# Patient Record
Sex: Female | Born: 1970 | Race: White | Hispanic: No | Marital: Married | Smoking: Never smoker
Health system: Southern US, Community
[De-identification: ages and names within clinical notes are randomized; demographics above are authoritative.]

## PROBLEM LIST (undated history)

## (undated) DIAGNOSIS — K2270 Barrett's esophagus without dysplasia: Secondary | ICD-10-CM

## (undated) DIAGNOSIS — K219 Gastro-esophageal reflux disease without esophagitis: Secondary | ICD-10-CM

## (undated) DIAGNOSIS — R519 Headache, unspecified: Secondary | ICD-10-CM

## (undated) DIAGNOSIS — H349 Unspecified retinal vascular occlusion: Secondary | ICD-10-CM

## (undated) DIAGNOSIS — F419 Anxiety disorder, unspecified: Secondary | ICD-10-CM

## (undated) DIAGNOSIS — J45909 Unspecified asthma, uncomplicated: Secondary | ICD-10-CM

## (undated) DIAGNOSIS — M797 Fibromyalgia: Secondary | ICD-10-CM

## (undated) DIAGNOSIS — J939 Pneumothorax, unspecified: Secondary | ICD-10-CM

## (undated) DIAGNOSIS — N83299 Other ovarian cyst, unspecified side: Secondary | ICD-10-CM

## (undated) DIAGNOSIS — I10 Essential (primary) hypertension: Secondary | ICD-10-CM

## (undated) DIAGNOSIS — M069 Rheumatoid arthritis, unspecified: Secondary | ICD-10-CM

## (undated) DIAGNOSIS — R51 Headache: Secondary | ICD-10-CM

## (undated) HISTORY — DX: Barrett's esophagus without dysplasia: K22.70

## (undated) HISTORY — PX: INTRAUTERINE DEVICE (IUD) INSERTION: SHX5877

## (undated) HISTORY — DX: Essential (primary) hypertension: I10

## (undated) HISTORY — DX: Pneumothorax, unspecified: J93.9

## (undated) HISTORY — DX: Headache, unspecified: R51.9

## (undated) HISTORY — DX: Unspecified asthma, uncomplicated: J45.909

## (undated) HISTORY — PX: CHOLECYSTECTOMY: SHX55

## (undated) HISTORY — DX: Rheumatoid arthritis, unspecified: M06.9

## (undated) HISTORY — DX: Headache: R51

## (undated) HISTORY — PX: OTHER SURGICAL HISTORY: SHX169

## (undated) HISTORY — DX: Unspecified retinal vascular occlusion: H34.9

## (undated) HISTORY — DX: Fibromyalgia: M79.7

## (undated) HISTORY — DX: Anxiety disorder, unspecified: F41.9

## (undated) HISTORY — PX: TONSILLECTOMY: SUR1361

## (undated) HISTORY — DX: Gastro-esophageal reflux disease without esophagitis: K21.9

## (undated) HISTORY — DX: Other ovarian cyst, unspecified side: N83.299

---

## 1999-03-30 ENCOUNTER — Other Ambulatory Visit: Admission: RE | Admit: 1999-03-30 | Discharge: 1999-03-30 | Payer: Self-pay | Admitting: Family Medicine

## 1999-09-02 ENCOUNTER — Encounter: Admission: RE | Admit: 1999-09-02 | Discharge: 1999-09-02 | Payer: Self-pay | Admitting: Family Medicine

## 1999-09-02 ENCOUNTER — Encounter: Payer: Self-pay | Admitting: Family Medicine

## 2000-03-27 ENCOUNTER — Other Ambulatory Visit: Admission: RE | Admit: 2000-03-27 | Discharge: 2000-03-27 | Payer: Self-pay | Admitting: Family Medicine

## 2001-01-03 ENCOUNTER — Encounter: Payer: Self-pay | Admitting: Family Medicine

## 2001-01-03 ENCOUNTER — Encounter: Admission: RE | Admit: 2001-01-03 | Discharge: 2001-01-03 | Payer: Self-pay | Admitting: Family Medicine

## 2001-03-16 ENCOUNTER — Encounter: Payer: Self-pay | Admitting: Orthopedic Surgery

## 2001-03-16 ENCOUNTER — Ambulatory Visit (HOSPITAL_COMMUNITY): Admission: RE | Admit: 2001-03-16 | Discharge: 2001-03-16 | Payer: Self-pay | Admitting: Orthopedic Surgery

## 2001-03-28 ENCOUNTER — Other Ambulatory Visit: Admission: RE | Admit: 2001-03-28 | Discharge: 2001-03-28 | Payer: Self-pay | Admitting: Obstetrics and Gynecology

## 2002-04-02 ENCOUNTER — Other Ambulatory Visit: Admission: RE | Admit: 2002-04-02 | Discharge: 2002-04-02 | Payer: Self-pay | Admitting: Obstetrics and Gynecology

## 2002-11-08 ENCOUNTER — Encounter: Admission: RE | Admit: 2002-11-08 | Discharge: 2002-11-08 | Payer: Self-pay

## 2002-11-12 ENCOUNTER — Encounter: Admission: RE | Admit: 2002-11-12 | Discharge: 2002-11-19 | Payer: Self-pay

## 2003-02-11 ENCOUNTER — Other Ambulatory Visit: Admission: RE | Admit: 2003-02-11 | Discharge: 2003-02-11 | Payer: Self-pay | Admitting: Obstetrics & Gynecology

## 2003-04-11 ENCOUNTER — Ambulatory Visit (HOSPITAL_COMMUNITY): Admission: RE | Admit: 2003-04-11 | Discharge: 2003-04-11 | Payer: Self-pay | Admitting: Obstetrics & Gynecology

## 2004-02-20 ENCOUNTER — Ambulatory Visit (HOSPITAL_COMMUNITY): Admission: RE | Admit: 2004-02-20 | Discharge: 2004-02-20 | Payer: Self-pay | Admitting: Gastroenterology

## 2004-02-21 ENCOUNTER — Inpatient Hospital Stay (HOSPITAL_COMMUNITY): Admission: AD | Admit: 2004-02-21 | Discharge: 2004-02-21 | Payer: Self-pay | Admitting: Obstetrics and Gynecology

## 2004-03-03 ENCOUNTER — Ambulatory Visit (HOSPITAL_COMMUNITY): Admission: RE | Admit: 2004-03-03 | Discharge: 2004-03-03 | Payer: Self-pay | Admitting: *Deleted

## 2004-09-17 ENCOUNTER — Inpatient Hospital Stay (HOSPITAL_COMMUNITY): Admission: AD | Admit: 2004-09-17 | Discharge: 2004-09-17 | Payer: Self-pay | Admitting: Obstetrics & Gynecology

## 2004-09-20 ENCOUNTER — Encounter (INDEPENDENT_AMBULATORY_CARE_PROVIDER_SITE_OTHER): Payer: Self-pay | Admitting: Specialist

## 2004-09-20 ENCOUNTER — Inpatient Hospital Stay (HOSPITAL_COMMUNITY): Admission: AD | Admit: 2004-09-20 | Discharge: 2004-09-22 | Payer: Self-pay | Admitting: Obstetrics and Gynecology

## 2004-11-15 ENCOUNTER — Encounter: Admission: RE | Admit: 2004-11-15 | Discharge: 2004-11-15 | Payer: Self-pay | Admitting: Obstetrics & Gynecology

## 2005-08-22 ENCOUNTER — Emergency Department (HOSPITAL_COMMUNITY): Admission: EM | Admit: 2005-08-22 | Discharge: 2005-08-22 | Payer: Self-pay | Admitting: Emergency Medicine

## 2006-11-01 ENCOUNTER — Encounter (INDEPENDENT_AMBULATORY_CARE_PROVIDER_SITE_OTHER): Payer: Self-pay | Admitting: *Deleted

## 2006-11-02 ENCOUNTER — Ambulatory Visit: Payer: Self-pay | Admitting: Emergency Medicine

## 2006-11-02 ENCOUNTER — Inpatient Hospital Stay (HOSPITAL_COMMUNITY): Admission: RE | Admit: 2006-11-02 | Discharge: 2006-11-06 | Payer: Self-pay | Admitting: *Deleted

## 2006-11-08 ENCOUNTER — Ambulatory Visit: Admission: AD | Admit: 2006-11-08 | Discharge: 2006-11-08 | Payer: Self-pay | Admitting: *Deleted

## 2006-11-11 ENCOUNTER — Inpatient Hospital Stay (HOSPITAL_COMMUNITY): Admission: AD | Admit: 2006-11-11 | Discharge: 2006-11-11 | Payer: Self-pay | Admitting: Obstetrics & Gynecology

## 2006-11-13 ENCOUNTER — Inpatient Hospital Stay (HOSPITAL_COMMUNITY): Admission: AD | Admit: 2006-11-13 | Discharge: 2006-11-13 | Payer: Self-pay | Admitting: Obstetrics and Gynecology

## 2006-11-15 ENCOUNTER — Ambulatory Visit: Payer: Self-pay | Admitting: Vascular Surgery

## 2006-11-15 ENCOUNTER — Ambulatory Visit (HOSPITAL_COMMUNITY): Admission: RE | Admit: 2006-11-15 | Discharge: 2006-11-15 | Payer: Self-pay | Admitting: Obstetrics & Gynecology

## 2006-11-15 ENCOUNTER — Encounter (INDEPENDENT_AMBULATORY_CARE_PROVIDER_SITE_OTHER): Payer: Self-pay | Admitting: Obstetrics & Gynecology

## 2006-11-30 ENCOUNTER — Encounter: Admission: RE | Admit: 2006-11-30 | Discharge: 2006-11-30 | Payer: Self-pay | Admitting: *Deleted

## 2006-12-01 ENCOUNTER — Emergency Department (HOSPITAL_COMMUNITY): Admission: EM | Admit: 2006-12-01 | Discharge: 2006-12-01 | Payer: Self-pay | Admitting: Emergency Medicine

## 2006-12-01 ENCOUNTER — Ambulatory Visit: Payer: Self-pay | Admitting: Emergency Medicine

## 2007-01-26 ENCOUNTER — Encounter: Admission: RE | Admit: 2007-01-26 | Discharge: 2007-01-26 | Payer: Self-pay | Admitting: Obstetrics & Gynecology

## 2007-04-26 ENCOUNTER — Telehealth (INDEPENDENT_AMBULATORY_CARE_PROVIDER_SITE_OTHER): Payer: Self-pay | Admitting: *Deleted

## 2007-05-07 ENCOUNTER — Telehealth (INDEPENDENT_AMBULATORY_CARE_PROVIDER_SITE_OTHER): Payer: Self-pay | Admitting: *Deleted

## 2007-05-08 ENCOUNTER — Ambulatory Visit: Payer: Self-pay | Admitting: Cardiology

## 2007-05-14 ENCOUNTER — Ambulatory Visit: Payer: Self-pay | Admitting: Emergency Medicine

## 2007-05-14 DIAGNOSIS — J984 Other disorders of lung: Secondary | ICD-10-CM | POA: Insufficient documentation

## 2007-05-14 DIAGNOSIS — J930 Spontaneous tension pneumothorax: Secondary | ICD-10-CM | POA: Insufficient documentation

## 2007-05-14 DIAGNOSIS — J939 Pneumothorax, unspecified: Secondary | ICD-10-CM | POA: Insufficient documentation

## 2007-05-14 DIAGNOSIS — N200 Calculus of kidney: Secondary | ICD-10-CM | POA: Insufficient documentation

## 2008-04-21 IMAGING — CR DG CHEST 2V
3 series · 3 of 3 positions shown · non-contrast
Comparison: 11/05/06.

CLINICAL DATA: Chest pain and shortness of breath.  Follow-up right pneumothorax.
 CHEST - 2 VIEW:

[view not recorded (1 of 3)]
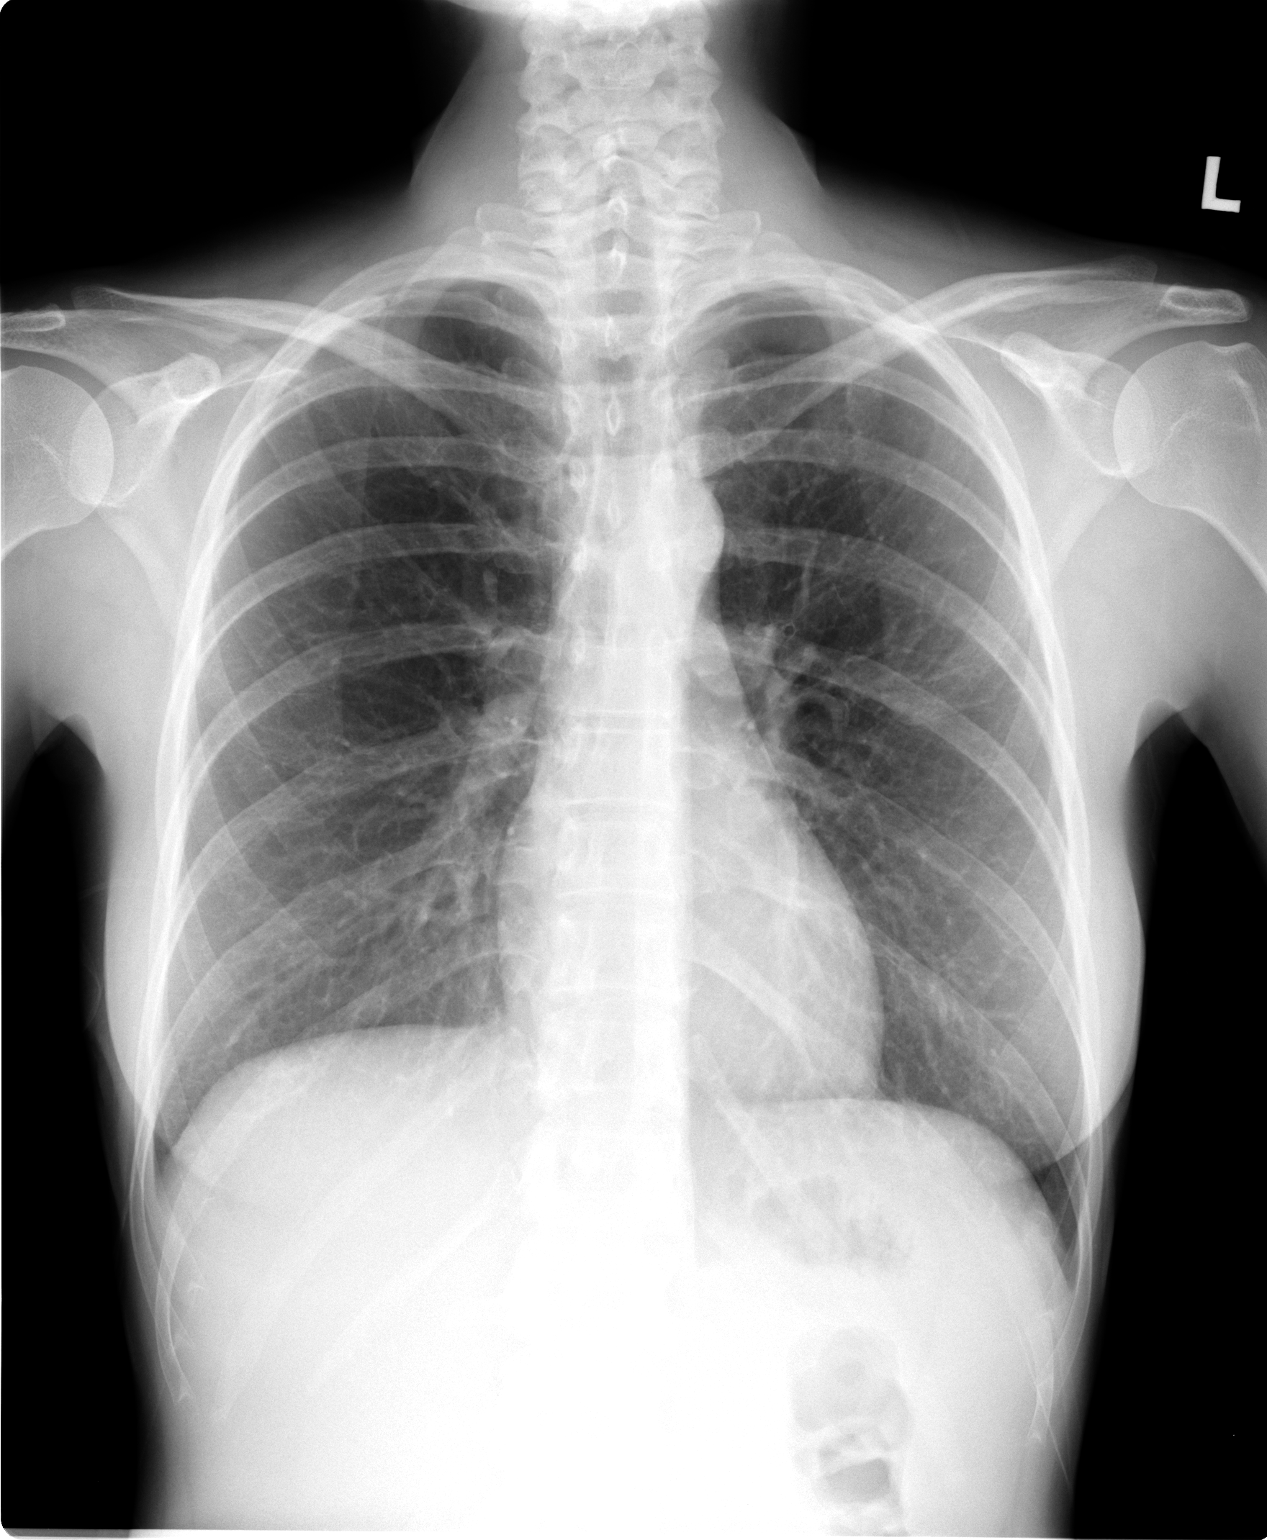

[view not recorded (2 of 3)]
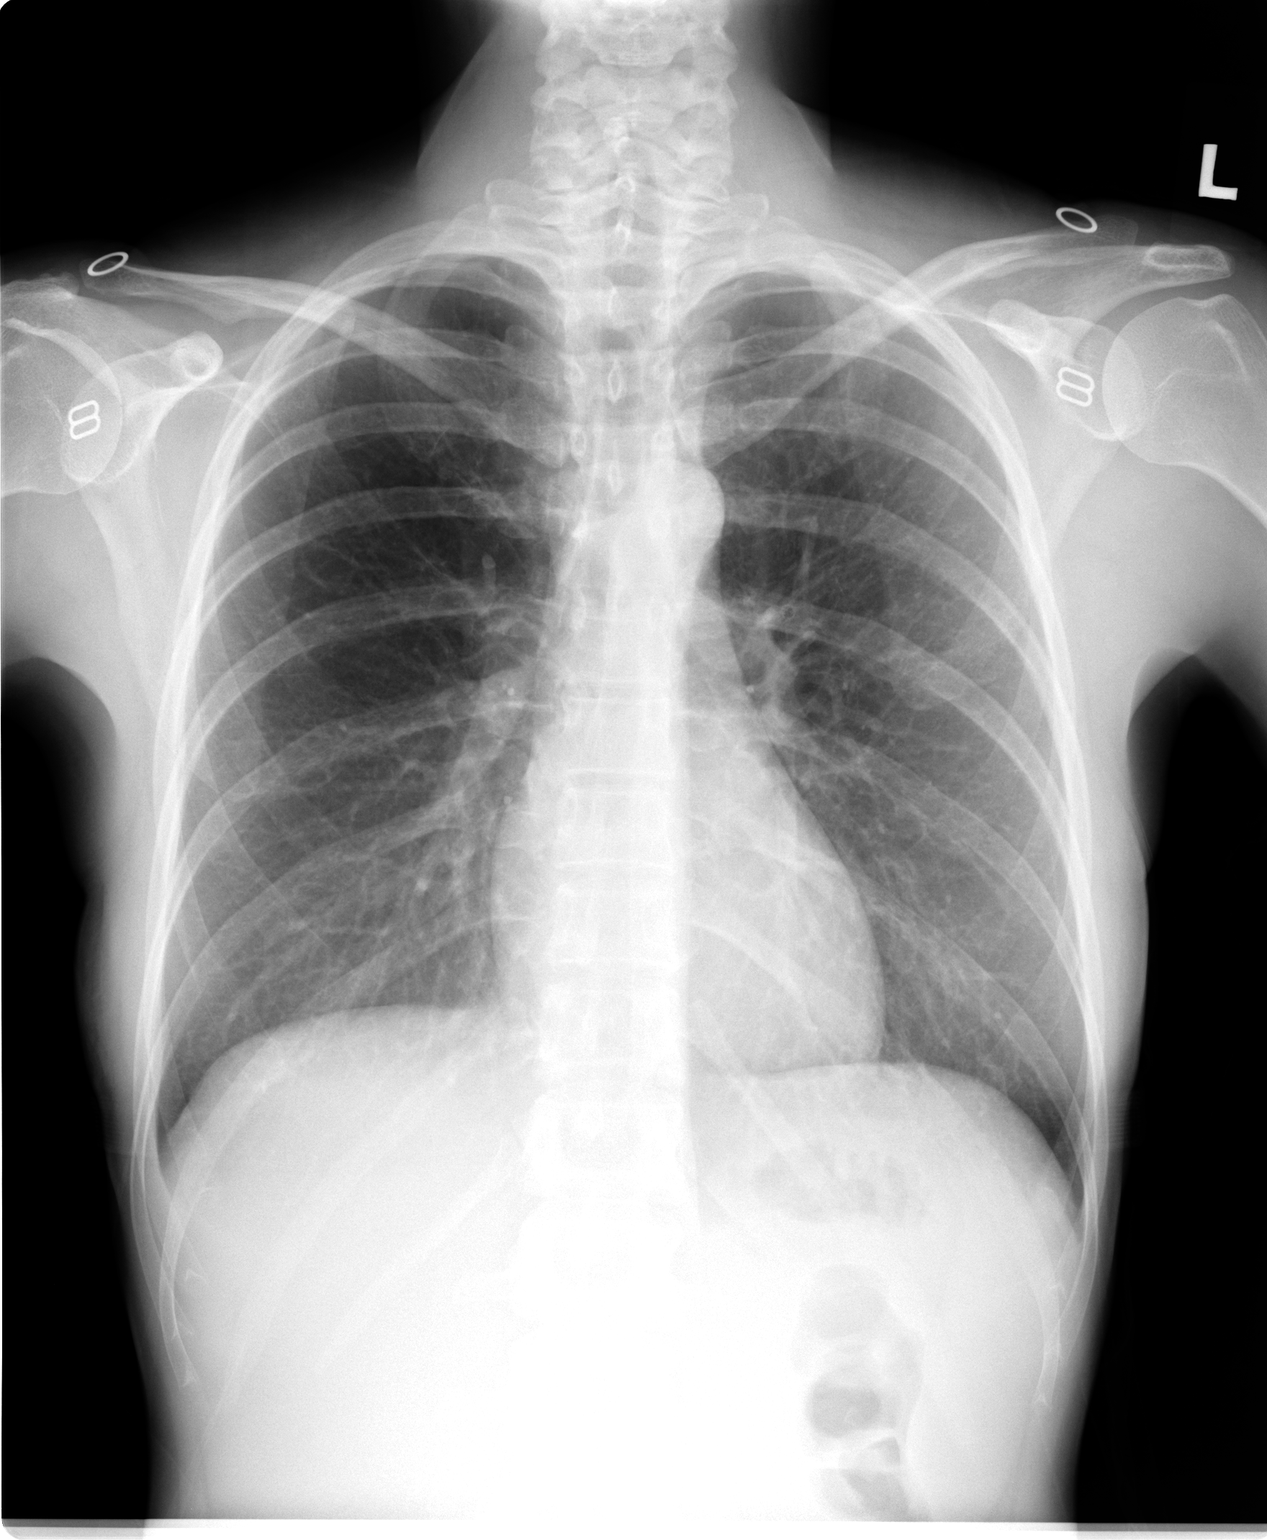

[view not recorded (3 of 3)]
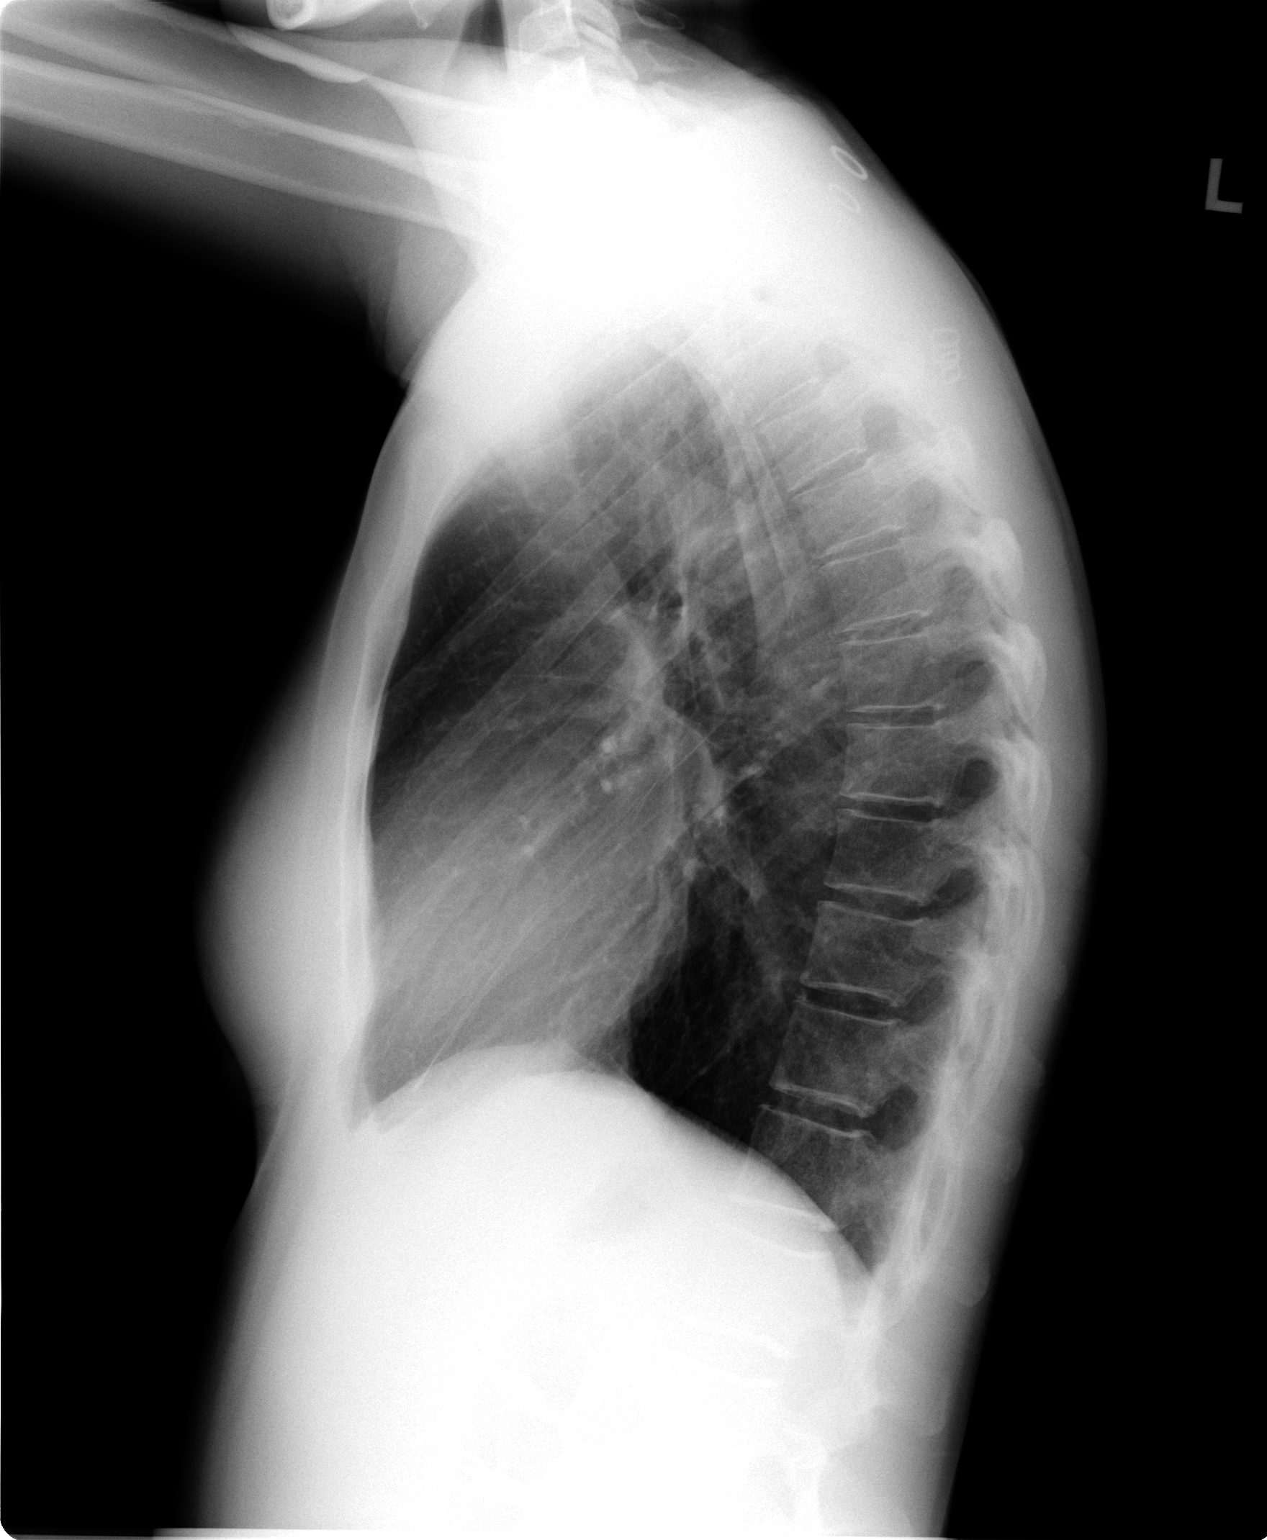

[3 of 3 positions shown; findings below may reference images not displayed]

FINDINGS: Previously seen right apical pneumothorax has resolved.  Both lungs are clear.  There is no evidence of pleural effusion.  Heart size and mediastinal contours are normal.
IMPRESSION: No active cardiopulmonary disease.

## 2008-10-26 ENCOUNTER — Encounter: Admission: RE | Admit: 2008-10-26 | Discharge: 2008-10-26 | Payer: Self-pay | Admitting: Family Medicine

## 2009-06-22 ENCOUNTER — Inpatient Hospital Stay (HOSPITAL_COMMUNITY): Admission: AD | Admit: 2009-06-22 | Discharge: 2009-06-22 | Payer: Self-pay | Admitting: Obstetrics & Gynecology

## 2009-07-22 ENCOUNTER — Inpatient Hospital Stay (HOSPITAL_COMMUNITY): Admission: AD | Admit: 2009-07-22 | Discharge: 2009-07-24 | Payer: Self-pay | Admitting: Obstetrics & Gynecology

## 2010-06-27 ENCOUNTER — Encounter: Payer: Self-pay | Admitting: Obstetrics & Gynecology

## 2010-06-27 ENCOUNTER — Encounter: Payer: Self-pay | Admitting: Emergency Medicine

## 2010-08-25 LAB — CBC
HCT: 31 % — ABNORMAL LOW (ref 36.0–46.0)
HCT: 39.1 % (ref 36.0–46.0)
Hemoglobin: 10.4 g/dL — ABNORMAL LOW (ref 12.0–15.0)
MCHC: 33.1 g/dL (ref 30.0–36.0)
MCHC: 33.6 g/dL (ref 30.0–36.0)
MCV: 86.8 fL (ref 78.0–100.0)
Platelets: 218 10*3/uL (ref 150–400)
RBC: 4.5 MIL/uL (ref 3.87–5.11)
RDW: 14.2 % (ref 11.5–15.5)
WBC: 8.2 10*3/uL (ref 4.0–10.5)

## 2010-10-19 NOTE — Consult Note (Signed)
[name], [name] NO.:  [id number]   MEDICAL RECORD NO.:  [id number]          PATIENT TYPE:  MAT   LOCATION:  MATC                          FACILITY:  [facility]   PHYSICIAN:  Lenoard Aden, M.D.DATE OF BIRTH:  [birth year]   DATE OF CONSULTATION:  DATE OF DISCHARGE:                                 CONSULTATION   EMERGENCY ROOM CONSULTATION NOTE   CHIEF COMPLAINT:  Difficulty breathing.   She is a 40 year old white female who is status post a complicated left  ovarian cystectomy with fulguration of endometriosis.  Postoperatively,  she had a recto-vaginal hematoma which enlarged and became subsequently  symptomatic and was subsequently drained on November 07, 2006 without  complications.  She presented to the Emergency Room subsequently on November 11, 2006 with a diagnosis of urinary tract infection for which she was  started on Macrobid.   ALLERGIES:  She has allergies to:  1. Nubain.  2. Ditropan.  3. Erythromycin.  4. Codeine.  5 Cipro.  1. Lodine.  2. Zithromax.  3. Walnuts.  4. Pecans.  5. Strawberries.  6. Animal dander.   She had a history of cholecystectomy, fibromyalgia, brachio plexus  disorder, and female infertility, esophageal reflux and laparoscopy as  noted.   She is a non-smoker, non-drinker.  She is denying domestic or physical  violence.   MEDICATIONS:  Per previous report include:  1. Ferrous sulfate.  2. Flomax  3. Dilaudid  4. Yasmin  5. Singulair  6. Allegra   She has a family history of diabetes,  heart disease, chronic  hypertension, myocardia infarction and colorectal cancer.   She has had one pregnancy.   PHYSICAL EXAMINATION:  She is a well developed, well nourished white  female in no acute distress.  HEENT:  Normal.  LUNGS:  Decreased breath sounds throughout the right lung field, normal  left lung fields.  HEART:  Regular rhythm.  ABDOMEN:  Soft, nontender.  All incisions well healed from previous  chest tube.   Reveals a well healed incision and three laparoscopic sites  which are intact and well healed.  PELVIC EXAM:  Deferred.  EXTREMITIES: Showed no cords.  NEUROLOGIC EXAM:  Nonfocal.   IMPRESSION:  1. Status post complicated laparoscopy with rectovaginal hematoma, now      status post drainage on November 08, 2006, with marked improvement in      her lower abdominal discomfort.  2. Status post spontaneous pneumothorax with chest tube placement.      Chest x-ray today is stable per Dr. Delton Coombes who previously placed her      chest tube.  A verbal discussion with this patient regarding this      patient performed with Dr. Delton Coombes.  3. Urinary tract infection, on Macrobid.   PLAN:  To discharge home.  Continue Dilaudid as needed.  Continue  Macrobid.  She has follow-up scheduled in the office in 24 hours.  Bleeding precautions and pain precautions were given.  The patient  acknowledges and will proceed.      Lenoard Aden, M.D.  Electronically Signed  RJT/MEDQ  D:  11/13/2006  T:  11/13/2006  Job:  [id number]

## 2010-10-19 NOTE — Discharge Summary (Signed)
NAMEBRITTIANY, WIEHE               ACCOUNT NO.:  [id number]   MEDICAL RECORD NO.:  [id number]          PATIENT TYPE:  INP   LOCATION:  9374                          [facility]ACILITY:  [facility]   PHYSICIAN:  Gerri Spore [name], M.D.  DATE OF BIRTH:  Apr 17, 1971   DATE OF ADMISSION:  11/02/2006  DATE OF DISCHARGE:  11/06/2006                               DISCHARGE SUMMARY   ADMISSION DIAGNOSES:  1. Lower abdominal pain.  2. Left ovarian cyst, suspected dermoid.   DISCHARGE DIAGNOSES:  1. Lower abdominal pain.  2. Left ovarian cyst, dermoid cyst on pathology.  3. Endometriosis of the posterior cul-de-sac.  4. Recto-vaginal septum hematoma.  5. Postoperative pneumothorax.  6. Urinary retention.  7. Anemia.   HISTORY OF PRESENT ILLNESS:  For the complete details, please see the  History and Physical on the chart.  Briefly, the patient presented as a  40 year old white female with a history of lower pelvic pain, previous  history of infertility and ultrasound findings suspicious of a left  dermoid cyst.   The patient underwent laparoscopy and left ovarian cystectomy.  She was  incidentally noted to have endometriosis of the posterior cul-de-sac.  Given her history of general pelvic pain and remote history of  infertility, this was fulgurated with cautery.  It was primarily in the  posterior cul-de-sac.  There was slight bleeding from the cul-de-sac  made hemostatic with a single laparoscopic stitch.  Cystoscopy was  performed which showed patency of the ureters.   HOSPITAL COURSE:  Postoperatively in the recovery room the patient had  complaints of lower rectal pressure and pelvic pressure.  She was kept  overnight for pain control.  Serial hemoglobins were followed given the  suspicion for rectovaginal hematoma.  She did drop her hemoglobin down  to 6.8 and was moderately symptomatic with dizziness and tachycardia.  She was transfused 2 units of packed cells, and her hemoglobin  stabilized  ultimately at 8.9.   To determine the location of bleeding, a CT of the abdomen and pelvis  was ordered.  This confirmed a 9-cm rectovaginal septum hematoma.  An  incidental note was made of an apparent right pneumothorax with  effusion.  Subsequent chest x-ray confirmed.  Consultation with  Pulmonary was obtained with Dr. Delton Coombes.  Given the fluid in the  pneumothorax, recommendation was made for chest tube placement.  It was  explained to the patient that is thought to be due to gas leaking across  a diaphragmatic hernia such as at the passage of the great vessels and  esophagus through the diaphragm.  It has been described in literature  within GYN and general surgery literature in the past.   Her pneumothorax resolved.  Chest tube was placed to water seal and did  not leak.  It was subsequently removed and follow-up chest x-ray showed  no reaccumulation.   Ultimately the patient was able to ambulate and tolerate a regular diet  without difficulty and no further chest-related problems.  She did have  some urinary retention believed to be related to the use of Tylox.  She  was switched to Vicodin and started on Flomax with relief of symptoms.  Urine culture was sent from an in-and-out cath on the day prior to  discharge to rule out infection.   DISCHARGE MEDICATIONS:  1. Vicodin 1 tablet every 4-6 hours as needed for pain.  2. Flomax 0.4 mg p.o. daily.  3. Ferrous sulfate 325 mg p.o. Anni.d.  4. Colace 100 mg p.o. daily.   FOLLOW UP:  OB/GYN Dr. Earlene Frazier in 1 week.   DISPOSITION AT DISCHARGE:  Stable and improved.   DISCHARGE INSTRUCTIONS:  Cough or fever, difficulty voiding, abdominal  pain or other concerns.      Gerri Spore [name], M.D.  Electronically Signed     WBD/MEDQ  D:  11/06/2006  T:  11/06/2006  Job:  [id number]

## 2010-10-19 NOTE — Op Note (Signed)
NAMELEOTHA, WESTERMEYER               ACCOUNT NO.:  [id number]   MEDICAL RECORD NO.:  [id number]          PATIENT TYPE:  AMB   LOCATION:  DFTL                          [facility]:  [facility]   PHYSICIAN:  Chapin B. Earlene Plater, M.D.  DATE OF BIRTH:  1971-01-12   DATE OF PROCEDURE:  11/08/2006  DATE OF DISCHARGE:                               OPERATIVE REPORT   PREOPERATIVE DIAGNOSIS:  Rectovaginal hematoma.   POSTOPERATIVE DIAGNOSIS:  Rectovaginal hematoma.   PROCEDURE:  Drainage of rectovaginal hematoma.   SURGEON:  Chester Holstein. Earlene Plater, M.D.   ASSISTANT:  None.   ANESTHESIA:  Mask.   SPECIMENS:  None.   ESTIMATED BLOOD LOSS:  Minimal.   COMPLICATIONS:  None.   FINDINGS:  An approximately 4 cm defect in the lower third of the vagina  from previous rectovaginal hematoma that drained spontaneously.   INDICATIONS FOR PROCEDURE:  Patient with a recent history of laparoscopy  for ovarian cystectomy.  She was incidentally noted to have posterior  cul-de-sac endometriosis which was treated with cautery.  Bleeding  ensued which could not be stopped with cautery and was made hemostatic  with laparoscopic suturing.  Postoperatively, the patient developed  lower rectal pressure and pain, was kept for observation, noted to  become anemic on serial hemoglobin.  CT scan showed a 9 cm rectovaginal  septal hematoma and incidental note of a right pneumothorax.   She had a chest tube for the pneumothorax at pulmonary advisement.  She  also was given blood for her symptomatic anemia.  After consultation  with several community physicians as well as Dr. Noland Fordyce at [facility], the decision was made not to drain the hematoma out of fear of  seeding a sterile pocket of blood which would be a good culture medium  and potentially lead to infection.  The patient was advised that it  might drain spontaneously or could resolve on its own.  The patient  presented back to the office in follow up yesterday and was  having  increased vaginal drainage of old clotted blood.  Exam in the office was  difficult due to discomfort, but was felt to have a vaginal defect, at  least 3 or so cm in diameter, and copious amounts of clot returning.  The vaginal mucosa could not be adequately assessed given her  discomfort.  I recommended we examine her under anesthesia, debride any  necrotic tissue, and evacuate as much hematoma as possible to expedite  the recovery process.  I, once again, discussed the case with Dr. Noland Fordyce  at [facility] who recommended against closure of this draining spot as this  would most likely be an increased risk for infection.  The advice was  that it would heal spontaneously as long as allowed to drain.   DESCRIPTION OF PROCEDURE:  The patient was taken to the operating room,  MAC anesthesia obtained.  She was prepped and draped in a standard  fashion, bladder emptied with in and out cath.  Exam under anesthesia  showed a 3-4 cm transverse defect in the lower 1/3 of the vagina  consistent  with site of spontaneously draining rectovaginal septal  hematoma.  There was still about 200 mL of old clot in this space.  This  was copiously irrigated and evacuated.  The edges of the vaginal mucosa  looked healthy and were already showing signs of granulating at the  edges.  The space was examined and as much clot as could be removed was  removed with irrigation and suction.  The space was probed gently with a  finger and did not feel like it tracked into any other higher space.  Rectovaginal exam was also performed and again confirmed integrity of  the rectal mucosa and no involvement by the hematoma.  Given that the  hematoma was as completely evacuated as possible and no tissue needed to  be debrided, the procedure was terminated.  The patient tolerated the  procedure well.  There were no complications.  She was taken to the  recovery room awake, alert, in stable condition.      Gerri Spore B.  Earlene Plater, M.D.  Electronically Signed     WBD/MEDQ  D:  11/08/2006  T:  11/08/2006  Job:  [id number]

## 2010-10-19 NOTE — Op Note (Signed)
NAMEROBENA, EWY               ACCOUNT NO.:  [id number]   MEDICAL RECORD NO.:  [id number]          PATIENT TYPE:  AMB   LOCATION:  SDC                           [facility]   PHYSICIAN:  Henry B. Earlene Plater, M.D.  DATE OF BIRTH:  Feb 06, 1971   DATE OF PROCEDURE:  11/01/2006  DATE OF DISCHARGE:                               OPERATIVE REPORT   PREOPERATIVE DIAGNOSES:  1. Left lower quadrant pain.  2. Left ovarian cyst, suspected dermoid.   POSTOPERATIVE DIAGNOSES:  1. Dermoid cyst, left ovary.  2. Absent right ovary.  3. Endometriosis in the posterior cul-de-sac.   PROCEDURES:  1. Laparoscopy.  2. Left ovarian cystectomy.  3. Fulguration of endometriosis of the posterior cul-de-sac.  4. Laparoscopic suturing of a bleeder in the posterior cul-de-sac.  5. Cystoscopy.   SURGEON:  Chester Holstein. Earlene Plater, M.D.   ASSISTANT:  None.   ANESTHESIA:  General.   SPECIMENS:  Left ovarian cyst, suspected dermoid, to pathology.   ESTIMATED BLOOD LOSS:  Minimal.   COMPLICATIONS:  None.   INDICATIONS:  Patient with a history of left lower quadrant pain.  Ultrasound showed a complex hyperechoic mass of the left ovary suspected  to be a dermoid cyst.  The patient was advised of the risks of surgery  including infection, bleeding, and damage to surrounding organs.   PROCEDURE:  The patient was taken to the operating room and general  anesthesia obtained, prepped and draped in a standard fashion, the  bladder emptied with a Foley catheter.  Exam under anesthesia showed the  uterus was retroverted.  A speculum inserted and the Hulka could not be  placed into the uterus; therefore, the single-tooth was attached to the  cervix and the acorn cannula inserted and inserted.   A 10 mm incision placed in the umbilicus and carried sharply to the  fascia.  The fascia was divided sharply and elevated, the posterior  sheath of the peritoneum elevated and entered sharply, a pursestring  suture of 0  Vicryl placed around the fascial defect.  A Hasson cannula  inserted and secured.  Pneumoperitoneum obtained with CO2 gas,  Trendelenburg position obtained.  An 11 mm port placed in the left lower  quadrant, a 5 mm in the right.  Each were Excel trocars, each placed  under direct laparoscopic visualization.   The pelvis was inspected with apparent left dermoid cyst and  endometriosis also noted in the posterior cul-de-sac.  The appendix was  not well-seen, presumed to be retrocecal.  There were adhesions in the  upper abdomen from previous laparoscopic cholecystectomy; therefore, the  liver edge and stomach were not well-seen.  The right ovary was noted to  be absent.  The patient reports no history of previous oophorectomy and,  of note, it was not seen on preop ultrasound.   The left ovary was approached first, the capsule incised with the plasma  spatula and the cyst separated from the ovary.  It did rupture slightly  and leak a little bit of sebaceous material; however, the remainder of  the cyst was removed easily and mostly  intact.  It was placed in a bag  and removed through the left lower quadrant port.   The bed of the ovary from the resection was made hemostatic with bipolar  cautery.   The endometriosis in the posterior cul-de-sac was again noted.  It  appeared to be fairly in the midline just inferior to the uterus and  into the cul-de-sac itself.  This was fulgurated with the plasma  spatula.  One area just to the right of midline began to bleed.  It  could not be made hemostatic with bipolar cautery using the [facility] or the standard Kleppingers; therefore, it was made hemostatic  with a figure-of-eight stitch laparoscopically of 0 Vicryl.  Given it  was slightly deviated from the midline, I elected to perform cystoscopy  to ensure integrity of the ureters.  Indigo carmine was given and  cystoscopy performed.  Each ureter was visualized and noted to freely   spill indigo carmine and no lesions of the bladder were noted.   The cystoscope was removed and attention returned to the abdomen,  pneumoperitoneum reobtained.  The area of suturing was hemostatic, as  was the left ovary.  No other abnormalities were seen and therefore the  procedure was terminated.   The inferior ports were removed.  Their sites were hemostatic.  The  scope was removed, gas released, the Hasson cannula removed.  The  umbilical incision elevated with the [employer] retractors, pursestring  suture snugged down.  This obliterated the fascial defect and no intra-  abdominal contents herniated through prior to closure.  The skin was  closed at the umbilicus with a running subcuticular stitch of 4-0 Vicryl  as it was oozing slightly at the skin edge.  The inferior ports were  closed with Dermabond.   The patient tolerated the procedure well with no complications.  She was  taken to the recovery room awake, alert, in stable condition.      Gerri Spore B. Earlene Plater, M.D.  Electronically Signed     WBD/MEDQ  D:  11/01/2006  T:  11/01/2006  Job:  [id number]

## 2010-10-19 NOTE — Consult Note (Signed)
NAMELIBBY, GOEHRING NO.:  [id number]   MEDICAL RECORD NO.:  [id number]          PATIENT TYPE:  INP   LOCATION:  9312                          [facility]ACILITY:  [facility]   PHYSICIAN:  Leslye Peer, MD    DATE OF BIRTH:  1971-04-26   DATE OF CONSULTATION:  11/02/2006  DATE OF DISCHARGE:                                 CONSULTATION   REASON FOR CONSULTATION:  We were asked by Dr. Delia Heady to see Ms.  Dornbush for a right hydropneumothorax and associated dyspnea.   BRIEF HISTORY:  Ms. 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  Her course  was complicated by postoperative pain, and she was found to have a  retrovaginal hematoma on CT scan of the chest.  She also began to  develop, last evening, right-sided pleuritic pain, dyspnea, and  hypoxemia.  She quickly corrected with placement of 2 liters per minute  by nasal cannula.  Her CT scan of the abdomen showed incidental  observation of right side hydropneumothorax.  We were consulted for  further management.   PAST MEDICAL HISTORY:  1. Allergic rhinitis.  2. Ovarian cyst.  3. Exercise-induced asthma, not currently on any maintenance      medications.  4. Status post cholecystectomy.  5. Status post tonsillectomy.   ALLERGIES:  1. CODEINE.  2. FLUOROQUINOLONES.  3. ERYTHROMYCIN.  4. AZITHROMYCIN.  5. FOOD ALLERGY TO NUTS.   HOME MEDICATIONS:  Singulair, Allegra, oral contraceptive pill, Xopenex  2 puffs q.4 h. p.r.n. for shortness of breath which she very  infrequently uses.   SOCIAL HISTORY:  The patient has a former, remote, small tobacco history  less than one pack year.  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  She denies any significant occupational exposures.  She has never  been exposed to  TB to her knowledge.   FAMILY HISTORY:  Noncontributory.   PHYSICAL EXAMINATION:  GENERAL:  This is a very pleasant thin woman who  is in no distress.  VITAL SIGNS:  Temperature 98.9, T-max 99.2, blood pressure 105/71, heart  rate 110-126, respiratory rate 20-22 per minute, SPO2 97% on 2 liters  per minute by nasal cannula.  HEENT:  The oropharynx is benign without lesion.  NECK:  Supple without lymphadenopathy or stridor.  LUNGS:  Significant for decreased breath sounds at the right base with  some superior bronchial breath sounds.  She has dullness on the right  half way up her thorax.  Her left side is clear.  HEART:  Her heart is regular but tachycardiac.  There is no murmur.  ABDOMEN:  Tender to palpation in the epigastric to suprapubic region.  There is no rebound.  She has hypoactive bowel sounds.  EXTREMITIES:  Have no cyanosis, clubbing or edema.  NEUROLOGIC:  She has a grossly nonfocal exam.   LABORATORY:  Hematocrit on presentation was  42.1.  This morning it was  19.7.  White blood cell count this morning 8.4, platelets 215.  Chest x-  ray performed this afternoon shows a 10-15% right-sided pneumothorax  with a moderate right-sided effusion and some air below the fluid at the  right hemidiaphragm.  CT scan the abdomen is as indicated above.   IMPRESSION:  1. Right hydropneumothorax, almost certainly secondary to her recent      laparoscopic procedure and the infusion of intraperitoneal gas.      Her fluid is likely reactive to the infused air.  I expect that we      will find that this is an eosinophilic fluid, as is often the case      in such a reactive effusion.  I will obtain a portable chest x-ray      now.  If her pneumothorax is increasing in size then at least she      will need a tube thoracostomy.  If the pneumothorax is no bigger,      than I recommend a thoracentesis either today or tomorrow (if the      pneumothorax is stable in size) to assess the fluid  chemistries and      also for symptomatic relief.  Her pneumothorax can be followed      expectantly if it is not enlarging after her pleural fluid is      drained.  2. History of exercise-induced bronchospasm without any evidence of      current exacerbation.  I will not start her on any bronchodilators.   Thank you very much.      Leslye Peer, MD  Electronically Signed     RSB/MEDQ  D:  11/02/2006  T:  11/02/2006  Job:  [id number]   cc:   Gerri Spore B. Earlene Plater, M.D.  Fax: [phone]

## 2010-10-19 NOTE — Assessment & Plan Note (Signed)
[facility]                             PULMONARY OFFICE NOTE   NUR, RABOLD                      MRN:          [id number]  DATE:12/01/2006                            DOB:          18-Oct-1970    SUBJECTIVE:  [name] is a 40 year old woman whom I met at [facility] when she was admitted for gynecological surgery. That surgery  was complicated by a right sided pneumothorax. I placed a right tube  thoracostomy and this was removed on 6/1. She was discharged from the  hospital. She did not experience any postoperative pain. Her breathing  was unlabored and she had no pleuritic symptoms. She was re-hospitalized  on 6/4 for a repeat surgical repair. Around that time, she also  complained of right calf pain. A right lower extremity Doppler  ultrasound was performed that did not show any evidence of DVT. Since  that second outpatient procedure, she has had at least one episode of  right sided pleuritic chest discomfort, and a chest x-ray has been  performed that did not show any pneumothorax. She has also had  hematuria. There has been some suspicion that she may have renal  calculi. She denies any cough. Beginning yesterday, 6/26, at midday, she  began to experience dull, mid-right, back pain that is not significantly  changed by position. It may be worse with a deep breath. She denies any  trauma or any significant exertion that would have caused a  musculoskeletal injury. She is not having any shortness of breath. She  did have some nausea and vomiting.   CURRENT MEDICATIONS:  1. Birth control pill.  2. Iron supplements.  3. Allegra 180 mg daily.  4. Vicodin p.r.n.   ALLERGIES:  1. NEWBANE.  2. DITROPAN.  3. ERYTHROMYCIN.  4. CODEINE.  5. CIPROFLOXACIN.  6. IODINE.  7. ZITHROMAX.   PHYSICAL EXAMINATION:  GENERAL:  This is a pleasant thin woman who is no  distress on room air.  VITAL SIGNS:  Weight 102 pounds, temperature 98.6,  blood pressure 94/66,  heart rate 83, SPO2 100% on room air.  HEENT:  Oropharynx benign.  NECK:  Supple without lymphadenopathy.  LUNGS:  Clear without any evidence of wheezing or crackles. She does not  have any significant musculoskeletal tenderness. Her tube thoracostomy  scar is intact without evidence of hernia.  HEART:  Regular rate and rhythm without murmur.  ABDOMEN:  Benign.  EXTREMITIES:  No cyanosis, clubbing, or edema. She does not have any  palpable cords in her popliteal fosse.   IMPRESSION:  New onset chest pain. I suspect that this is  musculoskeletal in nature. A chest x-ray performed today does not show  any evidence of pneumothorax, residual plural disease, or infiltrate. I  do feel that we should rule out pulmonary embolism, as she is a  moderate risk patient, and her pain is acute and onset. I will arrange  for a CT pulmonary angiogram or a VQ scan as soon as possible, and then  review the results with her.     Leslye Peer, MD  Electronically Signed    RSB/MedQ  DD: 12/01/2006  DT: 12/02/2006  Job #: [id number]   cc:   Crecencio Mc, M.D.  Gerri Spore B. Earlene Plater, M.D.

## 2010-10-22 NOTE — H&P (Signed)
NAMEJESSIKA, Frazier NO.:  [id number]   MEDICAL RECORD NO.:  [id number]          PATIENT TYPE:  MAT   LOCATION:  [facility]                          FACILITY:  [facility]   PHYSICIAN:  Richardean Sale, M.D.   DATE OF BIRTH:  Jan 02, 1971   DATE OF ADMISSION:  09/20/2004  DATE OF DISCHARGE:                                HISTORY & PHYSICAL   ADMITTING DIAGNOSIS:  Thirty-eight-plus-week intrauterine pregnancy with  ruptured membranes, active labor.   HISTORY OF PRESENT ILLNESS:  This is a 40 year old gravida 1 para 0 white  female at 38+ weeks with a due date of September 28, 2004 by ultrasound in the  first trimester who presents to maternity admissions complaining of  spontaneous rupture of membranes at approximately 3 p.m. The patient notes  fluid is clear, denies any vaginal bleeding, reports contractions every 3-5  minutes, and reports good movement. Prenatal care has been at [facility] with Dr. Genia Del as the primary attending. Pregnancy has  been complicated by cholecystectomy performed in the first trimester for  chronic cholecystitis.   PAST OBSTETRIC HISTORY:  Gravida 1 para 0.   GYNECOLOGIC HISTORY:  No abnormal Pap smears or STDs.   PAST MEDICAL HISTORY:  Reflux, scoliosis not requiring any surgery.   SURGICAL HISTORY:  Cholecystectomy.   FAMILY HISTORY:  No known birth defects, congenital anomalies, Down's  syndrome, spina bifida.   SOCIAL HISTORY:  Is married. No tobacco, alcohol, or drugs.   ALLERGIES:  1.  ERYTHROMYCIN.  2.  PENICILLIN.  3.  ZITHROMAX.  4.  CIPRO.  5.  LODEINE.  6.  CODEINE.   MEDICATIONS:  1.  Darvocet one tablet p.o. q.4-6h. p.r.n. pain.  2.  Flexeril 5 mg p.o. t.i.d. p.r.n. back pain.  3.  Prenatal vitamins and iron.   PRENATAL LABORATORY DATA:  The patient is Rh positive. Hepatitis B surface  antigen nonreactive. Rubella immune. RPR nonreactive. Group B beta strep  negative. One-hour Glucola within normal  limits.   PHYSICAL EXAMINATION:  VITAL SIGNS:  She is afebrile, vital signs are  stable.  GENERAL:  She is a well-developed, well-nourished white female who appears  uncomfortable with contractions but in no acute distress. Appears in active  labor.  HEART:  Regular rate and rhythm.  LUNGS:  Clear to auscultation bilaterally.  ABDOMEN:  Soft between contractions. Her contractions palpate moderate.  Appears AGA. No right upper quadrant tenderness.  PELVIC:  Cervix is 3 cm, 100% effaced, 0 station, positive clear fluid.  EXTREMITIES:  No cyanosis, clubbing, or edema, nontender.   Fetal heart rate tracing is in the 120s to 130s and reactive. No  decelerations noted. Contractions irregular.   ASSESSMENT:  A 40 year old gravida 1 para 0 white female at 38+ weeks  gestation with spontaneous rupture of membranes and contractions.   PLAN:  1.  Will admit to labor and delivery.  2.  Check CBC and hold clot on admission.  3.  Group B beta strep negative.  4.  The patient may have Stadol and Phenergan p.r.n. discomfort and may have  epidural when greater than 4 cm or in active labor.  5.  Continuous fetal monitoring.  6.  Anticipate attempt at vaginal delivery.      JW/MEDQ  D:  09/20/2004  T:  09/20/2004  Job:  [id number]

## 2010-10-22 NOTE — Consult Note (Signed)
NAMELYSA, LIVENGOOD NO.:  [id number]   MEDICAL RECORD NO.:  [id number]          PATIENT TYPE:  MAT   LOCATION:  [facility]                          [facility]ACILITY:  [facility]   PHYSICIAN:  Richardean Sale, M.D.   DATE OF BIRTH:  10-17-1970   DATE OF CONSULTATION:  DATE OF DISCHARGE:                                   CONSULTATION   CHIEF COMPLAINT:  Thirty-eight weeks with back pain.   HISTORY OF PRESENT ILLNESS:  This is a 40 year old white female gravida 1  para 0 at 51 and [redacted] weeks gestation who has been having progressively worse  upper back pain over the last few weeks. The patient has been evaluated  multiple times in the office. She has had reactive fetal heart rate  tracings. Her cervix has been 1 cm dilated, 80% effaced, and -1 station for  the last 3 weeks of her pregnancy. The patient has been seeing her  chiropractor. She has been offered physical therapy and has been offered  pain medication to control back pain. Workup so far as been entirely within  normal limits. The patient presents today complaining of inability to rest  comfortably secondary to her back pain. She denies any contractions, denies  any vaginal bleeding, no loss of fluid, reports good fetal movement. Denies  any headache, visual changes. No epigastric pain or any other unusual  symptoms. She denies any numbness or tingling in her extremities or loss of  motor function.   PAST MEDICAL HISTORY:  Significant for a cholecystectomy in the first  trimester secondary to chronic cholecystitis. Also has history of scoliosis  but has never had an surgical management for her scoliosis.   MEDICATIONS:  Prenatal vitamins, Zyrtec, Ambien, iron supplementation, and  Tylenol.   ALLERGIES:  1.  PENICILLIN.  2.  CIPRO.  3.  LEVAQUIN.  4.  ZITHROMAX.  5.  ERYTHROMYCIN.  6.  CODEINE.  7.  LODINE.   PHYSICAL EXAMINATION:  VITAL SIGNS:  Blood pressure 120/86, temperature 99,  heart rate 97, respiratory rate  20.  GENERAL:  She is a well-developed, well-nourished white female who is in no  acute distress who is sitting comfortably.  HEART:  Regular rate and rhythm.  LUNGS:  Clear to auscultation bilaterally.  BACK:  There is no CVA tenderness. There is no tenderness to palpation over  the spine and no palpable deformity.  ABDOMEN:  Soft, nontender, nondistended, appears AGA. No right upper  quadrant pain elicited on exam.  EXTREMITIES:  No cyanosis, clubbing, or edema. Deep tendon reflexes are 2+  bilaterally with no clonus.  PELVIC:  Cervix is 1 cm dilated, 80% effaced, and -1 station, vertex,  unchanged from previous exam.  NEUROLOGIC:  Nonfocal. There is normal motor function noted in all four  extremities, negative straight leg raise. Deep tendon reflexes normal at the  quadriceps and Achilles.   Fetal heart rate tracing is reactive with no decelerations. Tocometer shows  no contractions.   ASSESSMENT:  A 40 year old gravida 1 para 0 white female at 54 and [redacted] weeks  gestation by first trimester ultrasound  with upper back pain, likely  musculoskeletal.   PLAN:  1.  Will check liver function tests as well as amylase and lipase to rule      out any sort of pancreatitis, particularly since the patient did have      her gallbladder removed earlier in the pregnancy. If those labs are      normal, recommend discharge to home.  2.  The patient is asking to be delivered. I explained to her in great      detail that at [redacted] weeks gestation with a primigravida there is no      medical indication at this time for delivery and induction of labor      could result in a cesarean section and could possibly result in the      infant having to go to the NICU. The patient has been offered an      amniocentesis to document fetal lung maturity and she has declined this      option. She has been offered pain medication and has declined. She has      requested a muscle relaxer. We will start her on Flexeril  5 mg p.o.      t.i.d. p.r.n.  She has taken this in the past without any difficulty.   Recommend the patient pursue physical therapy as an option to treat her back  pain. She is to return to the hospital for any vaginal bleeding, loss of  fluid, decreased fetal movement, contractions, fever, nausea, vomiting,  diarrhea, numbness or tingling in the extremities, or any other unusual  symptoms.      JW/MEDQ  D:  09/17/2004  T:  09/17/2004  Job:  [id number]

## 2010-10-22 NOTE — Op Note (Signed)
NAME:  [name], [name]                         ACCOUNT NO.:  [id number]   MEDICAL RECORD NO.:  [id number]                   PATIENT TYPE:  OUT   LOCATION:  ULT                                  FACILITY:  [facility]   PHYSICIAN:  Anselmo Rod, M.D.               DATE OF BIRTH:  08-04-1970   DATE OF PROCEDURE:  02/20/2004  DATE OF DISCHARGE:                                 OPERATIVE REPORT   PROCEDURE PERFORMED:  Esophagogastroduodenoscopy.   ENDOSCOPIST:  Anselmo Rod, M.D.   INSTRUMENT USED:  Olympus video panendoscope.   INDICATION FOR PROCEDURE:  A 40 year old white female eight weeks pregnant,  undergoing an EGD for epigastric pain.  Rule out peptic ulcer disease,  esophagitis, gastritis, etc.   PREPROCEDURE PREPARATION:  Informed consent was procured from the patient.  The patient was fasted for eight hours prior to the procedure.   PREPROCEDURE PHYSICAL:  VITAL SIGNS:  The patient had stable vital signs.  NECK:  Supple.  CHEST:  Clear to auscultation.  S1, S2 regular.  ABDOMEN:  Soft with normal bowel sounds.  Epigastric tenderness on palpation  with guarding, no rebound or rigidity.   DESCRIPTION OF PROCEDURE:  The patient was placed in the left lateral  decubitus position and was given small dose of viscous lidocaine (10 mL).  The patient swallowed this without difficulty.  Her throat was sprayed with  Cetacaine spray and EGD was done without sedation.  The entire gastric  mucosa and proximal small bowel appeared normal, and so did the esophagus.  There was no ulcer seen.  There was no evidence of esophagitis.  The patient  became somewhat uncomfortable during the procedure and the scope was  withdrawn without retroflexion in the high cardia, and therefore the high  cardia was not visualized.   IMPRESSION:  1.  Essentially normal esophagogastroduodenoscopy.  2.  High cardia not visualized.   RECOMMENDATIONS:  Zantac 300 mg b.i.d. has been advised along with  Carafate  slurry 1 g q.i.d.  This has been called in to her pharmacy.  Follow-up is  advised in the office in the next two weeks or earlier if need be.  The  patient is to see Dr. Seymour Bars later today.  Further recommendations are to be  made by her.      JNM/MEDQ  D:  02/21/2004  T:  02/23/2004  Job:  [id number]   cc:   Genia Del, M.D.  [address]  [place]  [place] [address]  Fax: [phone]

## 2011-03-23 LAB — DIFFERENTIAL
Basophils Absolute: 0
Lymphocytes Relative: 31
Monocytes Absolute: 0.4
Monocytes Relative: 6
Neutro Abs: 3.3
Neutrophils Relative %: 51

## 2011-03-23 LAB — BASIC METABOLIC PANEL
Calcium: 9.8
Creatinine, Ser: 0.61
GFR calc Af Amer: >60
GFR calc non Af Amer: >60
Sodium: 138

## 2011-03-23 LAB — APTT: aPTT: 28

## 2011-03-23 LAB — CBC
Hemoglobin: 12
RBC: 4.1
RDW: 16 — ABNORMAL HIGH

## 2011-03-23 LAB — PROTIME-INR: INR: 1

## 2011-03-24 LAB — URINE MICROSCOPIC-ADD ON

## 2011-03-24 LAB — CBC
HCT: 25.8 — ABNORMAL LOW
HCT: 35.7 — ABNORMAL LOW
Hemoglobin: 11.9 — ABNORMAL LOW
MCHC: 33.6
MCHC: 33.7
MCV: 87.2
MCV: 88.5
MCV: 88.8
Platelets: 226
Platelets: 446 — ABNORMAL HIGH
RBC: 2.99 — ABNORMAL LOW
RBC: 3.34 — ABNORMAL LOW
RBC: 4.03
RDW: 14.6 — ABNORMAL HIGH
RDW: 15.2 — ABNORMAL HIGH
WBC: 12.6 — ABNORMAL HIGH

## 2011-03-24 LAB — URINALYSIS, ROUTINE W REFLEX MICROSCOPIC
Bilirubin Urine: NEGATIVE
Ketones, ur: NEGATIVE
Leukocytes, UA: NEGATIVE
Nitrite: NEGATIVE
Nitrite: POSITIVE — AB
Nitrite: POSITIVE — AB
Specific Gravity, Urine: 1.015
Specific Gravity, Urine: 1.025
Urobilinogen, UA: 0.2
pH: 6.5
pH: 6.5

## 2011-03-24 LAB — DIFFERENTIAL
Basophils Absolute: 0
Basophils Relative: 0
Eosinophils Absolute: 0.7
Eosinophils Absolute: 0.8 — ABNORMAL HIGH
Eosinophils Relative: 0
Eosinophils Relative: 6 — ABNORMAL HIGH
Lymphs Abs: 0.5 — ABNORMAL LOW
Lymphs Abs: 1.2
Monocytes Absolute: 0.1 — ABNORMAL LOW
Monocytes Absolute: 0.5
Monocytes Relative: 1 — ABNORMAL LOW
Monocytes Relative: 5
Neutro Abs: 7.4
Neutrophils Relative %: 80 — ABNORMAL HIGH

## 2011-03-24 LAB — URINALYSIS, DIPSTICK ONLY
Glucose, UA: 100 — AB
Ketones, ur: NEGATIVE
Protein, ur: >300 — AB
pH: 7

## 2011-03-24 LAB — STOOL CULTURE

## 2011-03-24 LAB — COMPREHENSIVE METABOLIC PANEL
AST: 20
CO2: 23
Calcium: 7.8 — ABNORMAL LOW
Creatinine, Ser: 0.93
GFR calc Af Amer: >60
GFR calc non Af Amer: >60
Glucose, Bld: 227 — ABNORMAL HIGH

## 2011-03-24 LAB — URINE CULTURE

## 2012-11-08 ENCOUNTER — Other Ambulatory Visit: Payer: Self-pay

## 2012-11-08 DIAGNOSIS — Z1231 Encounter for screening mammogram for malignant neoplasm of breast: Secondary | ICD-10-CM

## 2012-12-14 ENCOUNTER — Ambulatory Visit
Admission: RE | Admit: 2012-12-14 | Discharge: 2012-12-14 | Disposition: A | Discharge status: Home / Self Care | Payer: BC Managed Care – PPO | Source: Ambulatory Visit

## 2012-12-14 DIAGNOSIS — Z1231 Encounter for screening mammogram for malignant neoplasm of breast: Secondary | ICD-10-CM

## 2012-12-19 ENCOUNTER — Other Ambulatory Visit: Payer: Self-pay | Admitting: Obstetrics & Gynecology

## 2012-12-19 DIAGNOSIS — R928 Other abnormal and inconclusive findings on diagnostic imaging of breast: Secondary | ICD-10-CM

## 2012-12-31 ENCOUNTER — Ambulatory Visit
Admission: RE | Admit: 2012-12-31 | Discharge: 2012-12-31 | Disposition: A | Discharge status: Home / Self Care | Payer: BC Managed Care – PPO | Source: Ambulatory Visit | Attending: Obstetrics & Gynecology | Admitting: Obstetrics & Gynecology

## 2012-12-31 DIAGNOSIS — R928 Other abnormal and inconclusive findings on diagnostic imaging of breast: Secondary | ICD-10-CM

## 2013-11-26 ENCOUNTER — Other Ambulatory Visit: Payer: Self-pay

## 2013-11-26 DIAGNOSIS — Z1231 Encounter for screening mammogram for malignant neoplasm of breast: Secondary | ICD-10-CM

## 2013-12-16 ENCOUNTER — Ambulatory Visit
Admission: RE | Admit: 2013-12-16 | Discharge: 2013-12-16 | Disposition: A | Discharge status: Home / Self Care | Payer: BC Managed Care – PPO | Source: Ambulatory Visit

## 2013-12-16 DIAGNOSIS — Z1231 Encounter for screening mammogram for malignant neoplasm of breast: Secondary | ICD-10-CM

## 2013-12-18 ENCOUNTER — Other Ambulatory Visit: Payer: Self-pay | Admitting: Obstetrics & Gynecology

## 2013-12-18 DIAGNOSIS — R928 Other abnormal and inconclusive findings on diagnostic imaging of breast: Secondary | ICD-10-CM

## 2013-12-19 ENCOUNTER — Ambulatory Visit
Admission: RE | Admit: 2013-12-19 | Discharge: 2013-12-19 | Disposition: A | Discharge status: Home / Self Care | Payer: BC Managed Care – PPO | Source: Ambulatory Visit | Attending: Obstetrics & Gynecology | Admitting: Obstetrics & Gynecology

## 2013-12-19 ENCOUNTER — Encounter (INDEPENDENT_AMBULATORY_CARE_PROVIDER_SITE_OTHER): Payer: Self-pay

## 2013-12-19 DIAGNOSIS — R928 Other abnormal and inconclusive findings on diagnostic imaging of breast: Secondary | ICD-10-CM

## 2013-12-24 ENCOUNTER — Other Ambulatory Visit: Payer: BC Managed Care – PPO

## 2014-11-19 ENCOUNTER — Other Ambulatory Visit: Payer: Self-pay

## 2014-11-19 DIAGNOSIS — Z1231 Encounter for screening mammogram for malignant neoplasm of breast: Secondary | ICD-10-CM

## 2014-12-24 ENCOUNTER — Ambulatory Visit: Payer: Self-pay

## 2015-02-04 ENCOUNTER — Ambulatory Visit
Admission: RE | Admit: 2015-02-04 | Discharge: 2015-02-04 | Disposition: A | Discharge status: Home / Self Care | Payer: 59 | Source: Ambulatory Visit

## 2015-02-04 DIAGNOSIS — Z1231 Encounter for screening mammogram for malignant neoplasm of breast: Secondary | ICD-10-CM

## 2015-07-31 DIAGNOSIS — Z0000 Encounter for general adult medical examination without abnormal findings: Secondary | ICD-10-CM | POA: Insufficient documentation

## 2015-07-31 DIAGNOSIS — K219 Gastro-esophageal reflux disease without esophagitis: Secondary | ICD-10-CM | POA: Insufficient documentation

## 2015-07-31 DIAGNOSIS — R351 Nocturia: Secondary | ICD-10-CM | POA: Insufficient documentation

## 2015-07-31 DIAGNOSIS — M069 Rheumatoid arthritis, unspecified: Secondary | ICD-10-CM | POA: Insufficient documentation

## 2015-07-31 DIAGNOSIS — G43909 Migraine, unspecified, not intractable, without status migrainosus: Secondary | ICD-10-CM | POA: Insufficient documentation

## 2015-07-31 DIAGNOSIS — R3915 Urgency of urination: Secondary | ICD-10-CM | POA: Insufficient documentation

## 2015-07-31 DIAGNOSIS — R350 Frequency of micturition: Secondary | ICD-10-CM | POA: Insufficient documentation

## 2015-07-31 DIAGNOSIS — K2270 Barrett's esophagus without dysplasia: Secondary | ICD-10-CM | POA: Insufficient documentation

## 2015-07-31 DIAGNOSIS — IMO0002 Reserved for concepts with insufficient information to code with codable children: Secondary | ICD-10-CM | POA: Insufficient documentation

## 2015-07-31 DIAGNOSIS — J309 Allergic rhinitis, unspecified: Secondary | ICD-10-CM | POA: Insufficient documentation

## 2016-01-21 ENCOUNTER — Telehealth: Payer: Self-pay | Admitting: *Deleted

## 2016-01-21 NOTE — Telephone Encounter (Signed)
Error

## 2016-02-11 ENCOUNTER — Other Ambulatory Visit: Payer: Self-pay | Admitting: Family Medicine

## 2016-02-11 DIAGNOSIS — Z1231 Encounter for screening mammogram for malignant neoplasm of breast: Secondary | ICD-10-CM

## 2016-02-12 ENCOUNTER — Ambulatory Visit
Admission: RE | Admit: 2016-02-12 | Discharge: 2016-02-12 | Disposition: A | Discharge status: Home / Self Care | Payer: 59 | Source: Ambulatory Visit | Attending: Family Medicine | Admitting: Family Medicine

## 2016-02-12 DIAGNOSIS — Z1231 Encounter for screening mammogram for malignant neoplasm of breast: Secondary | ICD-10-CM

## 2016-05-24 ENCOUNTER — Other Ambulatory Visit: Payer: Self-pay | Admitting: Obstetrics and Gynecology

## 2016-05-24 ENCOUNTER — Ambulatory Visit
Admission: RE | Admit: 2016-05-24 | Discharge: 2016-05-24 | Disposition: A | Discharge status: Home / Self Care | Payer: 59 | Source: Ambulatory Visit | Attending: Obstetrics and Gynecology | Admitting: Obstetrics and Gynecology

## 2016-05-24 DIAGNOSIS — N6002 Solitary cyst of left breast: Secondary | ICD-10-CM

## 2016-06-01 ENCOUNTER — Other Ambulatory Visit: Payer: 59

## 2016-06-21 DIAGNOSIS — J0100 Acute maxillary sinusitis, unspecified: Secondary | ICD-10-CM | POA: Diagnosis not present

## 2016-07-08 DIAGNOSIS — R251 Tremor, unspecified: Secondary | ICD-10-CM | POA: Diagnosis not present

## 2016-07-08 DIAGNOSIS — R252 Cramp and spasm: Secondary | ICD-10-CM | POA: Diagnosis not present

## 2016-07-08 DIAGNOSIS — E559 Vitamin D deficiency, unspecified: Secondary | ICD-10-CM | POA: Diagnosis not present

## 2016-07-08 DIAGNOSIS — R000 Tachycardia, unspecified: Secondary | ICD-10-CM | POA: Diagnosis not present

## 2016-07-08 DIAGNOSIS — M791 Myalgia: Secondary | ICD-10-CM | POA: Diagnosis not present

## 2016-07-20 ENCOUNTER — Telehealth: Payer: Self-pay | Admitting: Neurology

## 2016-07-20 NOTE — Telephone Encounter (Signed)
I called pt and advised her that Dr. Rexene Alberts has a full schedule tomorrow. Pt asked me to call her if there is a cancellation next week.

## 2016-07-20 NOTE — Telephone Encounter (Signed)
[name] 12/28/1970 will be NP 2/27- she is being seen for acute tremors. She is having muscle spasms in the legs, she gets all over shakiness, predominately in the arms. Pt says she has a crazy work schedule, is there anyway she could be seen tomorrow in the morning. Pt was in the airport in [place] when she called flying back this afternoon. She said can LVM if she does not answer.

## 2016-07-21 NOTE — Telephone Encounter (Signed)
I spoke to pt and offered her a sooner appt for 07/26/2016 at 11:30am. Pt knows to arrive at 11:15am. Pt accepted this appt.

## 2016-07-26 ENCOUNTER — Ambulatory Visit (INDEPENDENT_AMBULATORY_CARE_PROVIDER_SITE_OTHER): Payer: 59 | Admitting: Neurology

## 2016-07-26 ENCOUNTER — Encounter: Payer: Self-pay | Admitting: Neurology

## 2016-07-26 ENCOUNTER — Telehealth: Payer: Self-pay | Admitting: Neurology

## 2016-07-26 VITALS — BP 134/90 | HR 89 | Resp 20 | Ht 63.0 in | Wt 105.0 lb

## 2016-07-26 DIAGNOSIS — G250 Essential tremor: Secondary | ICD-10-CM

## 2016-07-26 NOTE — Progress Notes (Signed)
Subjective:    Patient ID: [name] is a 46 y.o. female.  HPI     Star Age, MD, PhD [facility] [address], [address] [place], [place] [address]  Dear Dr. Addison Lank,   I saw your patient, [name], upon your kind request in my neurologic clinic today for initial consultation of her tremors. The patient is unaccompanied today. As you know, Ms. 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 I reviewed your office note from 07/08/2016. She reported an increase in her tremor after taking doxycycline for 4 days, she also could not tolerate it including nausea and abdominal pain. She reported muscle cramps as well. She was advised to continue her usual dose of Mysoline. She was also advised to take Ativan as needed. She had blood work in your office at the time including TSH, CBC with differential, CMP, magnesium, CPK, ESR, vitamin D level and I reviewed the results. Vitamin D was mildly low at 27.1, TSH normal, ESR normal at 4, CMP normal, CBC with differential normal, CK normal at 37. Magnesium was mildly elevated at 2.6. She has a history of essential tremor since before college. She has had mostly left-handed tremors, stable for years. She has been on Mysoline for the past year. She feels that her tremors have been worse in the past 6 weeks. She also reports burning sensation in both arms and tremors in her legs as well as cramping sensation in all 4 extremities. She may have experienced mild improvement recently. She tried an increased dose and Mysoline and did not think it  helped, she is back on taking Mysoline 250 mg once daily in the evening.  She has a family history of tremor in her father, who is currently not on any medication. Her father also has rheumatoid arthritis. She has 2 sisters, both without tremors. She feels that her tremor affects her work, it is made worse by caffeine and anxiety and stress. 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 She has been using Ativan as needed and uses it very infrequently, sometimes cuts the pill in half for quarters even. Recently, she has been taking Ativan almost on a daily basis. She does admit to having more anxiety lately. Alcohol helps tone down the tremor. She does not drink alcohol daily. She drinks about 1-3 times per week, she drinks 1 serving of caffeine per day, is a nonsmoker. She lives at home with her husband and 2 children. She tried propranolol in the past but it lowered her blood pressure too much. Of note, she had a brain MRI with and without contrast on 10/27/2008 which I reviewed: IMPRESSION:   1. No acute intracranial abnormality. 2.  Several small nonspecific foci of signal abnormality in the subcortical cerebral white matter, most notably the right frontal lobe.  No associated enhancement.  Differential considerations include accelerated/hereditary small vessel ischemia, sequelae of hypercoagulable state, vasculitis, migraines, prior infection or demyelination. 3.  Otherwise negative brain MRI.  She also had a brain MRA without contrast on 10/27/2008 which I reviewed: Negative intracranial MRA.  Her Past Medical History Is  Significant For: Past Medical History:  Diagnosis Date  . Asthma   . Fibromyalgia   . Functional ovarian cysts   . GERD (gastroesophageal reflux disease)   . Headache    migraine  . Hypertension   . Pneumothorax   . RA (rheumatoid arthritis) ([place])     Her Past Surgical History Is Significant  For: Past Surgical History:  Procedure Laterality Date  . CHOLECYSTECTOMY    . ovarian tumor    . TONSILLECTOMY      Her Family History Is Significant For: Family History  Problem Relation Age of Onset  . Rheumatologic disease Father   . Tremor Father   . Colon cancer Maternal Grandmother   . Heart attack Paternal Grandfather     Her Social History Is Significant For: Social History   Social History  . Marital status: Married    Spouse name: N/A  . Number of children: N/A  . Years of education: N/A   Social History Main Topics  . Smoking status: Never Smoker  . Smokeless tobacco: Never Used  . Alcohol use 1.8 oz/week    3 Glasses of wine per week  . Drug use: No  . Sexual activity: Not Asked   Other Topics Concern  . None   Social History Narrative  . None    Her Allergies Are:  Allergies  Allergen Reactions  . Ciprofloxacin   . Codeine   . Diclofenac Sodium   . Doxycycline Hyclate   . Erythromycin   . Iodine   . Levaquin [Levofloxacin In D5w]   . Nubain [Nalbuphine Hcl]   . Zithromax [Azithromycin]   . Penicillins Rash  :   Her Current Medications Are:  Outpatient Encounter Prescriptions as of 07/26/2016  Medication Sig  . Etanercept (ENBREL [place]) Inject into the skin.  . fexofenadine (ALLEGRA) 180 MG tablet Take 180 mg by mouth daily.  . fluticasone (FLONASE) 50 MCG/ACT nasal spray Place into both nostrils daily.  [name] Kitchen levonorgestrel (MIRENA) 20 MCG/24HR IUD 1 each by Intrauterine route once.  [name] Kitchen LORazepam (ATIVAN) 0.5 MG tablet Take 0.5 mg by mouth 2 (two) times daily as needed for anxiety.  [name] Kitchen omeprazole (PRILOSEC) 40 MG capsule Take 40 mg by mouth daily.  . primidone (MYSOLINE) 250 MG tablet Take 250 mg by mouth daily.  . Vitamin D, Ergocalciferol, (DRISDOL) 50000 units CAPS capsule Take 50,000 Units by mouth every 7 (seven) days.  . [DISCONTINUED] Cholecalciferol (VITAMIN D PO) Take 2,000 Units by mouth daily.   No facility-administered encounter  medications on file as of 07/26/2016.   : Review of Systems:  Out of a complete 14 point review of systems, all are reviewed and negative with the exception of these symptoms as listed below: Review of Systems  Neurological: Positive for tremors.       Pt presents today to discuss her tremors. Pt reports that her tremor is worse in the morning and has progressed in the past 6 weeks. Pt is taking primidone, which only partially helps her tremor.    Objective:  Neurologic Exam  Physical Exam Physical Examination:   Vitals:   07/26/16 1156  BP: 134/90  Pulse: 89  Resp: 20    General Examination: The patient is a very pleasant 46 y.o. female in no acute distress. She appears well-developed and well-nourished and well groomed.   HEENT: Normocephalic, atraumatic, pupils are equal, round, extraocular tracking is good without limitation to gaze excursion or nystagmus noted. Normal smooth pursuit is noted. Hearing  is grossly intact. Tympanic membranes are clear bilaterally. Face is symmetric with normal facial animation and normal facial sensation. Speech is clear with no dysarthria noted. There is no hypophonia. There is no lip, neck/head, jaw or voice tremor. Oropharynx exam reveals: no dysarthria.   Chest: Normal exam.   Heart: normal.  Abdomen: normal.   Extremities: There is no pitting edema in the distal lower extremities bilaterally. Pedal pulses are intact.  Skin: Warm and dry without trophic changes noted.  Musculoskeletal: exam reveals no obvious joint deformities, tenderness or joint swelling or erythema.   Neurologically:  Mental status: The patient is awake, alert and oriented in all 4 spheres. Her immediate and remote memory, attention, language skills and fund of knowledge are appropriate. There is no evidence of aphasia, agnosia, apraxia or anomia. Speech is clear with normal prosody and enunciation. Thought process is linear. Mood is normal and affect is normal.   Cranial nerves II - XII are as described above under HEENT exam. In addition: shoulder shrug is normal with equal shoulder height noted. Motor exam: Normal bulk, strength and tone is noted. There is no drift, resting tremor or rebound.  On 07/26/2016: Her handwriting is mildly tremulous, not micrographic, legible, on Archimedes spiral drawing she has coarse trembling bilaterally. She has mild bilateral upper extremity postural and action tremor.   Romberg is negative. Reflexes are 2+ throughout. Babinski: Toes are flexor bilaterally. Fine motor skills and coordination: intact with normal finger taps, normal hand movements, normal rapid alternating patting, normal foot taps and normal foot agility.  Cerebellar testing: No dysmetria or intention tremor on finger to nose testing. Heel to shin is unremarkable bilaterally. There is no truncal or gait ataxia.  Sensory exam: intact to light touch, pinprick, vibration, temperature sense in the upper and lower extremities.  Gait, station and balance: She stands easily. No veering to one side is noted. No leaning to one side is noted. Posture is age-appropriate and stance is narrow based. Gait shows normal stride length and normal pace. No problems turning are noted. Tandem walk is unremarkable.                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 Her history and physical exam are in keeping with essential tremor, overall mild findings, no other focality on exam. She reports recent exacerbation of her tremors suddenly after taking an antibiotic, I reassured her that I do not see any evidence  of lower extremity tremors or anything else alarming. She had a brain MRI with and without contrast in May 2010 which showed nonspecific white matter changes, no acute findings and MRA head at the time was normal. We can certainly repeat a brain MRI for comparison. This would help exclude structural changes. For her subjective complaint of pain and cramping I recommend EMG and nerve conduction testing to both upper extremities. She would like to think about these tests. I reassured her that I do not see anything alarming on exam and findings are in keeping with mild essential tremor. Unfortunately, there is not a whole lot of medication options available for her, she has already tried a beta blocker and had side effects, she is  on a relatively high dose of Mysoline and had increased it even without additional benefit so I would recommend not increasing this at this time. A trial of Topamax is sometimes utilize but I would worry about weight loss and kidney stone in her case. Of course there are surgical treatment options particularly DBS for severe cases. I did explain to her that tremors do get worse with stress, anxiety, sleep deprivation, dehydration and also situational with any type of acute illness. She may have experience exacerbation in the context of acute illness in the recent past. Overall, I tried to reassure the patient. She has on examination no evidence of parkinsonism. At this juncture, she is advised to talk to you about potentially seeing an endocrinologist because she had additional questions about her thyroid function and parathyroid function, in addition, she may benefit from anxiety management. I would not advise utilizing a benzodiazepine as tremor treatment long-term. At this juncture I will see her back on an as-needed basis. She is advised to consider brain MRI and EMG nerve conduction testing and is encouraged to call us if she would like to proceed with further testing. I answered all her  questions today and she was in agreement.  Thank you very much for allowing me to participate in the care of this nice patient. If I can be of any further assistance to you please do not hesitate to call me at [phone].  Sincerely,   Star Age, MD, PhD

## 2016-07-26 NOTE — Patient Instructions (Signed)
Please remember, that any kind of tremor may be exacerbated by anxiety, anger, nervousness, excitement, dehydration, sleep deprivation, by caffeine, and low blood sugar values or blood sugar fluctuations. Some medications, especially some antidepressants and lithium can cause or exacerbate tremors. Tremors may temporarily calm down or subside with the use of a benzodiazepine such as Valium or related medications and with alcohol. Be aware, however, that drinking alcohol is not an approved or appropriate treatment for tremor control and long-term use of benzodiazepines such as Valium, lorazepam, alprazolam, or clonazepam can cause habit formation, physical and psychological addiction. There are very few medications that symptomatically help with tremor reduction, none are without potential side effects.   I would recommend no new medications at this time.   We can consider a brain MRI and we can consider an EMG/NCV.   Your exam shows a mild degree of tremor and your history is in keeping with essential.   As discussed, management of underlying anxiety may help as well.

## 2016-07-26 NOTE — Telephone Encounter (Signed)
History does not suggest Parkinson's disease and exam does not suggest any parkinsonism. Please call pt back to clarify.

## 2016-07-27 NOTE — Telephone Encounter (Signed)
I called pt and advised her of this information. Pt verbalized understanding and will follow up with Dr. Addison Lank.

## 2016-08-02 ENCOUNTER — Ambulatory Visit: Payer: 59 | Admitting: Neurology

## 2016-08-19 DIAGNOSIS — L718 Other rosacea: Secondary | ICD-10-CM | POA: Diagnosis not present

## 2016-08-30 DIAGNOSIS — Z01419 Encounter for gynecological examination (general) (routine) without abnormal findings: Secondary | ICD-10-CM | POA: Diagnosis not present

## 2016-09-02 ENCOUNTER — Other Ambulatory Visit: Payer: Self-pay

## 2016-09-02 ENCOUNTER — Other Ambulatory Visit: Payer: Self-pay | Admitting: Obstetrics & Gynecology

## 2016-09-02 DIAGNOSIS — N6002 Solitary cyst of left breast: Secondary | ICD-10-CM

## 2016-09-05 DIAGNOSIS — M2550 Pain in unspecified joint: Secondary | ICD-10-CM | POA: Diagnosis not present

## 2016-09-05 DIAGNOSIS — M0579 Rheumatoid arthritis with rheumatoid factor of multiple sites without organ or systems involvement: Secondary | ICD-10-CM | POA: Diagnosis not present

## 2016-09-08 ENCOUNTER — Telehealth: Payer: Self-pay | Admitting: *Deleted

## 2016-09-08 ENCOUNTER — Other Ambulatory Visit: Payer: Self-pay | Admitting: Obstetrics & Gynecology

## 2016-09-08 DIAGNOSIS — N6002 Solitary cyst of left breast: Secondary | ICD-10-CM

## 2016-09-08 NOTE — Telephone Encounter (Signed)
Pt scheduled on 09/13/16 @ 9:40am at breast center, left message for pt to call.

## 2016-09-08 NOTE — Telephone Encounter (Signed)
Pt informed with the below note. 

## 2016-09-08 NOTE — Telephone Encounter (Signed)
I would like her to have a Dx Mammo/US, if possible aspiration the same visit.

## 2016-09-08 NOTE — Telephone Encounter (Signed)
Dr.Lavoie we received a fax from [facility] stating pt will need to be scheduled for breast cyst aspiration/ breast biopsy due to left breast cyst at the breast center. Orders in epic are for diag. left breast mammogram and left breast ultrasound do you want pt to have this done first and also order breast aspiration? Or just breast aspiration? I will send pt paper chart to you. Please advise

## 2016-09-13 ENCOUNTER — Ambulatory Visit
Admission: RE | Admit: 2016-09-13 | Discharge: 2016-09-13 | Disposition: A | Discharge status: Home / Self Care | Payer: 59 | Source: Ambulatory Visit | Attending: Obstetrics & Gynecology | Admitting: Obstetrics & Gynecology

## 2016-09-13 ENCOUNTER — Other Ambulatory Visit (HOSPITAL_COMMUNITY)
Admission: RE | Admit: 2016-09-13 | Discharge: 2016-09-13 | Disposition: A | Discharge status: Home / Self Care | Payer: 59 | Source: Ambulatory Visit | Attending: Obstetrics & Gynecology | Admitting: Obstetrics & Gynecology

## 2016-09-13 ENCOUNTER — Encounter: Payer: Self-pay | Admitting: Radiology

## 2016-09-13 DIAGNOSIS — N6002 Solitary cyst of left breast: Secondary | ICD-10-CM

## 2016-09-13 DIAGNOSIS — N6012 Diffuse cystic mastopathy of left breast: Secondary | ICD-10-CM | POA: Diagnosis not present

## 2016-10-11 DIAGNOSIS — M2550 Pain in unspecified joint: Secondary | ICD-10-CM | POA: Diagnosis not present

## 2016-10-11 DIAGNOSIS — M0579 Rheumatoid arthritis with rheumatoid factor of multiple sites without organ or systems involvement: Secondary | ICD-10-CM | POA: Diagnosis not present

## 2016-10-19 DIAGNOSIS — L814 Other melanin hyperpigmentation: Secondary | ICD-10-CM | POA: Diagnosis not present

## 2016-10-19 DIAGNOSIS — D1801 Hemangioma of skin and subcutaneous tissue: Secondary | ICD-10-CM | POA: Diagnosis not present

## 2016-10-19 DIAGNOSIS — L821 Other seborrheic keratosis: Secondary | ICD-10-CM | POA: Diagnosis not present

## 2016-10-25 DIAGNOSIS — G250 Essential tremor: Secondary | ICD-10-CM | POA: Diagnosis not present

## 2016-10-25 DIAGNOSIS — E559 Vitamin D deficiency, unspecified: Secondary | ICD-10-CM | POA: Diagnosis not present

## 2017-01-03 ENCOUNTER — Other Ambulatory Visit: Payer: Self-pay | Admitting: Obstetrics & Gynecology

## 2017-01-03 DIAGNOSIS — Z1231 Encounter for screening mammogram for malignant neoplasm of breast: Secondary | ICD-10-CM

## 2017-02-09 DIAGNOSIS — B029 Zoster without complications: Secondary | ICD-10-CM | POA: Diagnosis not present

## 2017-02-17 ENCOUNTER — Ambulatory Visit: Payer: 59

## 2017-03-17 ENCOUNTER — Ambulatory Visit: Payer: 59

## 2017-03-26 DIAGNOSIS — K2270 Barrett's esophagus without dysplasia: Secondary | ICD-10-CM | POA: Diagnosis not present

## 2017-03-26 DIAGNOSIS — Z79899 Other long term (current) drug therapy: Secondary | ICD-10-CM | POA: Diagnosis not present

## 2017-03-26 DIAGNOSIS — K219 Gastro-esophageal reflux disease without esophagitis: Secondary | ICD-10-CM | POA: Diagnosis not present

## 2017-04-22 DIAGNOSIS — R109 Unspecified abdominal pain: Secondary | ICD-10-CM | POA: Diagnosis not present

## 2017-06-09 DIAGNOSIS — K219 Gastro-esophageal reflux disease without esophagitis: Secondary | ICD-10-CM | POA: Diagnosis not present

## 2017-06-09 DIAGNOSIS — H6993 Unspecified Eustachian tube disorder, bilateral: Secondary | ICD-10-CM | POA: Diagnosis not present

## 2017-06-09 DIAGNOSIS — H6983 Other specified disorders of Eustachian tube, bilateral: Secondary | ICD-10-CM | POA: Insufficient documentation

## 2017-06-09 DIAGNOSIS — J019 Acute sinusitis, unspecified: Secondary | ICD-10-CM | POA: Diagnosis not present

## 2017-06-21 DIAGNOSIS — H6983 Other specified disorders of Eustachian tube, bilateral: Secondary | ICD-10-CM | POA: Diagnosis not present

## 2017-06-28 DIAGNOSIS — Z0000 Encounter for general adult medical examination without abnormal findings: Secondary | ICD-10-CM | POA: Diagnosis not present

## 2017-06-28 DIAGNOSIS — G250 Essential tremor: Secondary | ICD-10-CM | POA: Diagnosis not present

## 2017-06-28 DIAGNOSIS — E559 Vitamin D deficiency, unspecified: Secondary | ICD-10-CM | POA: Diagnosis not present

## 2017-08-23 DIAGNOSIS — M8588 Other specified disorders of bone density and structure, other site: Secondary | ICD-10-CM | POA: Diagnosis not present

## 2017-08-24 DIAGNOSIS — M2550 Pain in unspecified joint: Secondary | ICD-10-CM | POA: Diagnosis not present

## 2017-08-24 DIAGNOSIS — M0579 Rheumatoid arthritis with rheumatoid factor of multiple sites without organ or systems involvement: Secondary | ICD-10-CM | POA: Diagnosis not present

## 2017-09-17 DIAGNOSIS — R69 Illness, unspecified: Secondary | ICD-10-CM | POA: Diagnosis not present

## 2017-09-21 DIAGNOSIS — R05 Cough: Secondary | ICD-10-CM | POA: Diagnosis not present

## 2017-09-25 DIAGNOSIS — M0579 Rheumatoid arthritis with rheumatoid factor of multiple sites without organ or systems involvement: Secondary | ICD-10-CM | POA: Diagnosis not present

## 2017-09-27 DIAGNOSIS — R109 Unspecified abdominal pain: Secondary | ICD-10-CM | POA: Diagnosis not present

## 2017-09-27 DIAGNOSIS — K2270 Barrett's esophagus without dysplasia: Secondary | ICD-10-CM | POA: Diagnosis not present

## 2017-09-27 DIAGNOSIS — K219 Gastro-esophageal reflux disease without esophagitis: Secondary | ICD-10-CM | POA: Diagnosis not present

## 2017-09-28 DIAGNOSIS — M546 Pain in thoracic spine: Secondary | ICD-10-CM | POA: Diagnosis not present

## 2017-09-28 DIAGNOSIS — R079 Chest pain, unspecified: Secondary | ICD-10-CM | POA: Diagnosis not present

## 2017-10-02 DIAGNOSIS — M9903 Segmental and somatic dysfunction of lumbar region: Secondary | ICD-10-CM | POA: Diagnosis not present

## 2017-10-02 DIAGNOSIS — M9902 Segmental and somatic dysfunction of thoracic region: Secondary | ICD-10-CM | POA: Diagnosis not present

## 2017-10-02 DIAGNOSIS — M9901 Segmental and somatic dysfunction of cervical region: Secondary | ICD-10-CM | POA: Diagnosis not present

## 2017-10-04 DIAGNOSIS — M546 Pain in thoracic spine: Secondary | ICD-10-CM | POA: Diagnosis not present

## 2017-10-04 DIAGNOSIS — M25512 Pain in left shoulder: Secondary | ICD-10-CM | POA: Diagnosis not present

## 2017-10-04 DIAGNOSIS — M545 Low back pain: Secondary | ICD-10-CM | POA: Diagnosis not present

## 2017-10-05 DIAGNOSIS — M546 Pain in thoracic spine: Secondary | ICD-10-CM | POA: Diagnosis not present

## 2017-10-10 DIAGNOSIS — M546 Pain in thoracic spine: Secondary | ICD-10-CM | POA: Diagnosis not present

## 2017-10-10 DIAGNOSIS — E559 Vitamin D deficiency, unspecified: Secondary | ICD-10-CM | POA: Diagnosis not present

## 2017-10-10 DIAGNOSIS — J029 Acute pharyngitis, unspecified: Secondary | ICD-10-CM | POA: Diagnosis not present

## 2017-10-13 DIAGNOSIS — M546 Pain in thoracic spine: Secondary | ICD-10-CM | POA: Diagnosis not present

## 2017-10-16 DIAGNOSIS — M546 Pain in thoracic spine: Secondary | ICD-10-CM | POA: Diagnosis not present

## 2017-10-18 DIAGNOSIS — M546 Pain in thoracic spine: Secondary | ICD-10-CM | POA: Diagnosis not present

## 2017-10-23 DIAGNOSIS — M546 Pain in thoracic spine: Secondary | ICD-10-CM | POA: Diagnosis not present

## 2017-10-24 DIAGNOSIS — M546 Pain in thoracic spine: Secondary | ICD-10-CM | POA: Diagnosis not present

## 2017-10-27 DIAGNOSIS — M546 Pain in thoracic spine: Secondary | ICD-10-CM | POA: Diagnosis not present

## 2017-10-31 DIAGNOSIS — M546 Pain in thoracic spine: Secondary | ICD-10-CM | POA: Diagnosis not present

## 2017-11-02 DIAGNOSIS — M25512 Pain in left shoulder: Secondary | ICD-10-CM | POA: Diagnosis not present

## 2017-11-03 DIAGNOSIS — M546 Pain in thoracic spine: Secondary | ICD-10-CM | POA: Diagnosis not present

## 2017-11-03 DIAGNOSIS — M25512 Pain in left shoulder: Secondary | ICD-10-CM | POA: Diagnosis not present

## 2017-11-07 DIAGNOSIS — M25512 Pain in left shoulder: Secondary | ICD-10-CM | POA: Diagnosis not present

## 2017-11-09 DIAGNOSIS — M546 Pain in thoracic spine: Secondary | ICD-10-CM | POA: Diagnosis not present

## 2017-11-13 DIAGNOSIS — M546 Pain in thoracic spine: Secondary | ICD-10-CM | POA: Diagnosis not present

## 2017-11-27 DIAGNOSIS — M546 Pain in thoracic spine: Secondary | ICD-10-CM | POA: Diagnosis not present

## 2017-11-28 DIAGNOSIS — M546 Pain in thoracic spine: Secondary | ICD-10-CM | POA: Diagnosis not present

## 2017-11-28 DIAGNOSIS — M5134 Other intervertebral disc degeneration, thoracic region: Secondary | ICD-10-CM | POA: Diagnosis not present

## 2017-12-01 DIAGNOSIS — M546 Pain in thoracic spine: Secondary | ICD-10-CM | POA: Diagnosis not present

## 2017-12-04 DIAGNOSIS — M9901 Segmental and somatic dysfunction of cervical region: Secondary | ICD-10-CM | POA: Diagnosis not present

## 2017-12-04 DIAGNOSIS — M546 Pain in thoracic spine: Secondary | ICD-10-CM | POA: Diagnosis not present

## 2017-12-04 DIAGNOSIS — M25512 Pain in left shoulder: Secondary | ICD-10-CM | POA: Diagnosis not present

## 2017-12-05 DIAGNOSIS — M9901 Segmental and somatic dysfunction of cervical region: Secondary | ICD-10-CM | POA: Diagnosis not present

## 2017-12-05 DIAGNOSIS — M546 Pain in thoracic spine: Secondary | ICD-10-CM | POA: Diagnosis not present

## 2017-12-05 DIAGNOSIS — M25512 Pain in left shoulder: Secondary | ICD-10-CM | POA: Diagnosis not present

## 2017-12-12 DIAGNOSIS — M546 Pain in thoracic spine: Secondary | ICD-10-CM | POA: Diagnosis not present

## 2017-12-12 DIAGNOSIS — M9901 Segmental and somatic dysfunction of cervical region: Secondary | ICD-10-CM | POA: Diagnosis not present

## 2017-12-12 DIAGNOSIS — M25512 Pain in left shoulder: Secondary | ICD-10-CM | POA: Diagnosis not present

## 2017-12-14 DIAGNOSIS — M9901 Segmental and somatic dysfunction of cervical region: Secondary | ICD-10-CM | POA: Diagnosis not present

## 2017-12-14 DIAGNOSIS — M25512 Pain in left shoulder: Secondary | ICD-10-CM | POA: Diagnosis not present

## 2017-12-14 DIAGNOSIS — M546 Pain in thoracic spine: Secondary | ICD-10-CM | POA: Diagnosis not present

## 2017-12-18 DIAGNOSIS — M546 Pain in thoracic spine: Secondary | ICD-10-CM | POA: Diagnosis not present

## 2017-12-18 DIAGNOSIS — M25512 Pain in left shoulder: Secondary | ICD-10-CM | POA: Diagnosis not present

## 2017-12-18 DIAGNOSIS — M9901 Segmental and somatic dysfunction of cervical region: Secondary | ICD-10-CM | POA: Diagnosis not present

## 2017-12-19 DIAGNOSIS — M9901 Segmental and somatic dysfunction of cervical region: Secondary | ICD-10-CM | POA: Diagnosis not present

## 2017-12-19 DIAGNOSIS — M25512 Pain in left shoulder: Secondary | ICD-10-CM | POA: Diagnosis not present

## 2017-12-19 DIAGNOSIS — M546 Pain in thoracic spine: Secondary | ICD-10-CM | POA: Diagnosis not present

## 2017-12-25 DIAGNOSIS — M9901 Segmental and somatic dysfunction of cervical region: Secondary | ICD-10-CM | POA: Diagnosis not present

## 2017-12-25 DIAGNOSIS — M546 Pain in thoracic spine: Secondary | ICD-10-CM | POA: Diagnosis not present

## 2017-12-25 DIAGNOSIS — M25512 Pain in left shoulder: Secondary | ICD-10-CM | POA: Diagnosis not present

## 2017-12-28 DIAGNOSIS — M9901 Segmental and somatic dysfunction of cervical region: Secondary | ICD-10-CM | POA: Diagnosis not present

## 2017-12-28 DIAGNOSIS — M546 Pain in thoracic spine: Secondary | ICD-10-CM | POA: Diagnosis not present

## 2017-12-28 DIAGNOSIS — M25512 Pain in left shoulder: Secondary | ICD-10-CM | POA: Diagnosis not present

## 2017-12-31 DIAGNOSIS — M546 Pain in thoracic spine: Secondary | ICD-10-CM | POA: Diagnosis not present

## 2018-01-01 DIAGNOSIS — M546 Pain in thoracic spine: Secondary | ICD-10-CM | POA: Diagnosis not present

## 2018-01-01 DIAGNOSIS — M9901 Segmental and somatic dysfunction of cervical region: Secondary | ICD-10-CM | POA: Diagnosis not present

## 2018-01-01 DIAGNOSIS — M25512 Pain in left shoulder: Secondary | ICD-10-CM | POA: Diagnosis not present

## 2018-01-04 DIAGNOSIS — M546 Pain in thoracic spine: Secondary | ICD-10-CM | POA: Diagnosis not present

## 2018-01-04 DIAGNOSIS — M9901 Segmental and somatic dysfunction of cervical region: Secondary | ICD-10-CM | POA: Diagnosis not present

## 2018-01-04 DIAGNOSIS — M25512 Pain in left shoulder: Secondary | ICD-10-CM | POA: Diagnosis not present

## 2018-01-31 DIAGNOSIS — M546 Pain in thoracic spine: Secondary | ICD-10-CM | POA: Diagnosis not present

## 2018-02-01 DIAGNOSIS — M25512 Pain in left shoulder: Secondary | ICD-10-CM | POA: Diagnosis not present

## 2018-02-01 DIAGNOSIS — M7502 Adhesive capsulitis of left shoulder: Secondary | ICD-10-CM | POA: Diagnosis not present

## 2018-02-07 DIAGNOSIS — M7502 Adhesive capsulitis of left shoulder: Secondary | ICD-10-CM | POA: Insufficient documentation

## 2018-02-20 DIAGNOSIS — J029 Acute pharyngitis, unspecified: Secondary | ICD-10-CM | POA: Diagnosis not present

## 2018-03-06 ENCOUNTER — Other Ambulatory Visit: Payer: Self-pay | Admitting: Obstetrics & Gynecology

## 2018-03-06 DIAGNOSIS — Z23 Encounter for immunization: Secondary | ICD-10-CM | POA: Diagnosis not present

## 2018-03-06 DIAGNOSIS — M0579 Rheumatoid arthritis with rheumatoid factor of multiple sites without organ or systems involvement: Secondary | ICD-10-CM | POA: Diagnosis not present

## 2018-03-06 DIAGNOSIS — M2550 Pain in unspecified joint: Secondary | ICD-10-CM | POA: Diagnosis not present

## 2018-03-06 DIAGNOSIS — H6980 Other specified disorders of Eustachian tube, unspecified ear: Secondary | ICD-10-CM | POA: Diagnosis not present

## 2018-03-06 DIAGNOSIS — Z8679 Personal history of other diseases of the circulatory system: Secondary | ICD-10-CM | POA: Diagnosis not present

## 2018-03-06 DIAGNOSIS — E559 Vitamin D deficiency, unspecified: Secondary | ICD-10-CM | POA: Diagnosis not present

## 2018-03-06 DIAGNOSIS — Z1239 Encounter for other screening for malignant neoplasm of breast: Secondary | ICD-10-CM

## 2018-03-28 DIAGNOSIS — R109 Unspecified abdominal pain: Secondary | ICD-10-CM | POA: Diagnosis not present

## 2018-03-28 DIAGNOSIS — K2270 Barrett's esophagus without dysplasia: Secondary | ICD-10-CM | POA: Diagnosis not present

## 2018-04-06 ENCOUNTER — Ambulatory Visit
Admission: RE | Admit: 2018-04-06 | Discharge: 2018-04-06 | Disposition: A | Discharge status: Home / Self Care | Payer: 59 | Source: Ambulatory Visit | Attending: Obstetrics & Gynecology | Admitting: Obstetrics & Gynecology

## 2018-04-06 DIAGNOSIS — Z1231 Encounter for screening mammogram for malignant neoplasm of breast: Secondary | ICD-10-CM | POA: Diagnosis not present

## 2018-04-06 DIAGNOSIS — Z1239 Encounter for other screening for malignant neoplasm of breast: Secondary | ICD-10-CM

## 2018-04-30 DIAGNOSIS — M542 Cervicalgia: Secondary | ICD-10-CM | POA: Diagnosis not present

## 2018-04-30 DIAGNOSIS — M25512 Pain in left shoulder: Secondary | ICD-10-CM | POA: Diagnosis not present

## 2018-04-30 DIAGNOSIS — M546 Pain in thoracic spine: Secondary | ICD-10-CM | POA: Diagnosis not present

## 2018-05-01 DIAGNOSIS — M7502 Adhesive capsulitis of left shoulder: Secondary | ICD-10-CM | POA: Diagnosis not present

## 2018-05-14 DIAGNOSIS — M25512 Pain in left shoulder: Secondary | ICD-10-CM | POA: Diagnosis not present

## 2018-05-14 DIAGNOSIS — M7502 Adhesive capsulitis of left shoulder: Secondary | ICD-10-CM | POA: Diagnosis not present

## 2018-05-14 DIAGNOSIS — M25612 Stiffness of left shoulder, not elsewhere classified: Secondary | ICD-10-CM | POA: Diagnosis not present

## 2018-05-25 DIAGNOSIS — M25512 Pain in left shoulder: Secondary | ICD-10-CM | POA: Diagnosis not present

## 2018-05-25 DIAGNOSIS — M25612 Stiffness of left shoulder, not elsewhere classified: Secondary | ICD-10-CM | POA: Diagnosis not present

## 2018-05-25 DIAGNOSIS — M7502 Adhesive capsulitis of left shoulder: Secondary | ICD-10-CM | POA: Diagnosis not present

## 2018-05-28 DIAGNOSIS — M7502 Adhesive capsulitis of left shoulder: Secondary | ICD-10-CM | POA: Diagnosis not present

## 2018-05-28 DIAGNOSIS — M25512 Pain in left shoulder: Secondary | ICD-10-CM | POA: Diagnosis not present

## 2018-05-28 DIAGNOSIS — M25612 Stiffness of left shoulder, not elsewhere classified: Secondary | ICD-10-CM | POA: Diagnosis not present

## 2018-06-01 DIAGNOSIS — M25512 Pain in left shoulder: Secondary | ICD-10-CM | POA: Diagnosis not present

## 2018-06-01 DIAGNOSIS — M7502 Adhesive capsulitis of left shoulder: Secondary | ICD-10-CM | POA: Diagnosis not present

## 2018-06-01 DIAGNOSIS — M25612 Stiffness of left shoulder, not elsewhere classified: Secondary | ICD-10-CM | POA: Diagnosis not present

## 2018-06-04 ENCOUNTER — Ambulatory Visit (INDEPENDENT_AMBULATORY_CARE_PROVIDER_SITE_OTHER): Payer: 59 | Admitting: Obstetrics & Gynecology

## 2018-06-04 ENCOUNTER — Encounter: Payer: Self-pay | Admitting: Obstetrics & Gynecology

## 2018-06-04 VITALS — BP 128/86 | Ht 63.5 in | Wt 128.4 lb

## 2018-06-04 DIAGNOSIS — Z30431 Encounter for routine checking of intrauterine contraceptive device: Secondary | ICD-10-CM

## 2018-06-04 DIAGNOSIS — Z01419 Encounter for gynecological examination (general) (routine) without abnormal findings: Secondary | ICD-10-CM

## 2018-06-04 DIAGNOSIS — M25612 Stiffness of left shoulder, not elsewhere classified: Secondary | ICD-10-CM | POA: Diagnosis not present

## 2018-06-04 DIAGNOSIS — M7502 Adhesive capsulitis of left shoulder: Secondary | ICD-10-CM | POA: Diagnosis not present

## 2018-06-04 DIAGNOSIS — M25512 Pain in left shoulder: Secondary | ICD-10-CM | POA: Diagnosis not present

## 2018-06-04 NOTE — Patient Instructions (Signed)
1. Encounter for gynecological examination with Papanicolaou smear of cervix Normal gynecologic exam.  Pap reflex done.  Breasts normal.  Screening mammo negative 04/2018.  Health labs with Fam MD.  BMI good at 22.39.  Aerobic physical activity 5 x per week and weightlifting every 2 days. - Pap IG w/ reflex to HPV when ASC-U  2. Encounter for routine checking of intrauterine contraceptive device (IUD) Mirena IUD in good position and well tolerated.  Will f/u to change Mirena IUD under US guidance.  Other orders - citalopram (CELEXA) 20 MG tablet; Take 20 mg by mouth daily. - cetirizine (ZYRTEC) 10 MG tablet; Take 10 mg by mouth daily.  [name], it was a pleasure seeing you today!  I will inform you of your results as soon as they are available.

## 2018-06-04 NOTE — Progress Notes (Signed)
[name] 1970-11-03 [id number]   History:    47 y.o. [place] Married.  Son is 88, daughter is 25.  RP:  Established patient presenting for annual gyn exam   HPI: Well on Mirena IUD since April 2015. Mild menstrual periods.  Occasionally using 1/2 of a 0.025 Estradiol patch when having Vasomotor Menopausal Sxs.  No pelvic pain.  Normal vaginal secretions.  No pain with IC.  Urine/BMs wnl.  Breasts normal.  BMI 22.39.  Will increase fitness.  Health labs with Fam MD.  Past medical history,surgical history, family history and social history were all reviewed and documented in the EPIC chart.  Gynecologic History No LMP recorded. (Menstrual status: IUD). Contraception: Mirena IUD x 09/2013 Last Pap: 2017. Results were: normal Last mammogram: 04/2018. Results were: Negative Bone Density: Never Colonoscopy: 2016  Obstetric History OB History  Gravida Para Term Preterm AB Living  2 2       2   SAB TAB Ectopic Multiple Live Births               # Outcome Date GA Lbr Len/2nd Weight Sex Delivery Anes PTL Lv  2 Para           1 Para              ROS: A ROS was performed and pertinent positives and negatives are included in the history.  GENERAL: No fevers or chills. HEENT: No change in vision, no earache, sore throat or sinus congestion. NECK: No pain or stiffness. CARDIOVASCULAR: No chest pain or pressure. No palpitations. PULMONARY: No shortness of breath, cough or wheeze. GASTROINTESTINAL: No abdominal pain, nausea, vomiting or diarrhea, melena or bright red blood per rectum. GENITOURINARY: No urinary frequency, urgency, hesitancy or dysuria. MUSCULOSKELETAL: No joint or muscle pain, no back pain, no recent trauma. DERMATOLOGIC: No rash, no itching, no lesions. ENDOCRINE: No polyuria, polydipsia, no heat or cold intolerance. No recent change in weight. HEMATOLOGICAL: No anemia or easy bruising or bleeding. NEUROLOGIC: No headache, seizures, numbness, tingling or weakness. PSYCHIATRIC:  No depression, no loss of interest in normal activity or change in sleep pattern.     Exam:   BP 128/86   Ht 5' 3.5" (1.613 m)   Wt 128 lb 6.4 oz (58.2 kg)   BMI 22.39 kg/m   Body mass index is 22.39 kg/m.  General appearance : Well developed well nourished female. No acute distress HEENT: Eyes: no retinal hemorrhage or exudates,  Neck supple, trachea midline, no carotid bruits, no thyroidmegaly Lungs: Clear to auscultation, no rhonchi or wheezes, or rib retractions  Heart: Regular rate and rhythm, no murmurs or gallops Breast:Examined in sitting and supine position were symmetrical in appearance, no palpable masses or tenderness,  no skin retraction, no nipple inversion, no nipple discharge, no skin discoloration, no axillary or supraclavicular lymphadenopathy Abdomen: no palpable masses or tenderness, no rebound or guarding Extremities: no edema or skin discoloration or tenderness  Pelvic: Vulva: Normal             Vagina: No gross lesions or discharge  Cervix: No gross lesions or discharge.  Pap reflex done.  IUD strings visible  Uterus  AV, normal size, shape and consistency, non-tender and mobile  Adnexa  Without masses or tenderness  Anus: Normal   Assessment/Plan:  47 y.o. female for annual exam   1. Encounter for gynecological examination with Papanicolaou smear of cervix Normal gynecologic exam.  Pap reflex done.  Breasts normal.  Screening  mammo negative 04/2018.  Health labs with Fam MD.  BMI good at 22.39.  Aerobic physical activity 5 x per week and weightlifting every 2 days. - Pap IG w/ reflex to HPV when ASC-U  2. Encounter for routine checking of intrauterine contraceptive device (IUD) Mirena IUD in good position and well tolerated.  Will f/u to change Mirena IUD under US guidance.  Other orders - citalopram (CELEXA) 20 MG tablet; Take 20 mg by mouth daily. - cetirizine (ZYRTEC) 10 MG tablet; Take 10 mg by mouth daily.  Princess Bruins MD, 9:21 AM  06/04/2018

## 2018-06-05 LAB — PAP IG W/ RFLX HPV ASCU

## 2018-06-08 DIAGNOSIS — M25512 Pain in left shoulder: Secondary | ICD-10-CM | POA: Diagnosis not present

## 2018-06-08 DIAGNOSIS — M7502 Adhesive capsulitis of left shoulder: Secondary | ICD-10-CM | POA: Diagnosis not present

## 2018-06-08 DIAGNOSIS — M25612 Stiffness of left shoulder, not elsewhere classified: Secondary | ICD-10-CM | POA: Diagnosis not present

## 2018-06-13 ENCOUNTER — Other Ambulatory Visit: Payer: Self-pay | Admitting: Obstetrics & Gynecology

## 2018-06-13 DIAGNOSIS — Z30430 Encounter for insertion of intrauterine contraceptive device: Secondary | ICD-10-CM

## 2018-06-15 DIAGNOSIS — M7502 Adhesive capsulitis of left shoulder: Secondary | ICD-10-CM | POA: Diagnosis not present

## 2018-06-15 DIAGNOSIS — M25512 Pain in left shoulder: Secondary | ICD-10-CM | POA: Diagnosis not present

## 2018-06-15 DIAGNOSIS — M25612 Stiffness of left shoulder, not elsewhere classified: Secondary | ICD-10-CM | POA: Diagnosis not present

## 2018-06-18 DIAGNOSIS — M25512 Pain in left shoulder: Secondary | ICD-10-CM | POA: Diagnosis not present

## 2018-06-18 DIAGNOSIS — M25612 Stiffness of left shoulder, not elsewhere classified: Secondary | ICD-10-CM | POA: Diagnosis not present

## 2018-06-18 DIAGNOSIS — M7502 Adhesive capsulitis of left shoulder: Secondary | ICD-10-CM | POA: Diagnosis not present

## 2018-06-20 ENCOUNTER — Other Ambulatory Visit: Payer: 59

## 2018-06-20 ENCOUNTER — Ambulatory Visit: Payer: 59 | Admitting: Obstetrics & Gynecology

## 2018-06-22 DIAGNOSIS — M25512 Pain in left shoulder: Secondary | ICD-10-CM | POA: Diagnosis not present

## 2018-06-22 DIAGNOSIS — M25612 Stiffness of left shoulder, not elsewhere classified: Secondary | ICD-10-CM | POA: Diagnosis not present

## 2018-06-22 DIAGNOSIS — M7502 Adhesive capsulitis of left shoulder: Secondary | ICD-10-CM | POA: Diagnosis not present

## 2018-06-25 DIAGNOSIS — M7502 Adhesive capsulitis of left shoulder: Secondary | ICD-10-CM | POA: Diagnosis not present

## 2018-06-25 DIAGNOSIS — M25512 Pain in left shoulder: Secondary | ICD-10-CM | POA: Diagnosis not present

## 2018-06-25 DIAGNOSIS — M25612 Stiffness of left shoulder, not elsewhere classified: Secondary | ICD-10-CM | POA: Diagnosis not present

## 2018-06-29 DIAGNOSIS — M7502 Adhesive capsulitis of left shoulder: Secondary | ICD-10-CM | POA: Diagnosis not present

## 2018-06-29 DIAGNOSIS — M25512 Pain in left shoulder: Secondary | ICD-10-CM | POA: Diagnosis not present

## 2018-06-29 DIAGNOSIS — M25612 Stiffness of left shoulder, not elsewhere classified: Secondary | ICD-10-CM | POA: Diagnosis not present

## 2018-07-02 DIAGNOSIS — M25612 Stiffness of left shoulder, not elsewhere classified: Secondary | ICD-10-CM | POA: Diagnosis not present

## 2018-07-02 DIAGNOSIS — M7502 Adhesive capsulitis of left shoulder: Secondary | ICD-10-CM | POA: Diagnosis not present

## 2018-07-02 DIAGNOSIS — M25512 Pain in left shoulder: Secondary | ICD-10-CM | POA: Diagnosis not present

## 2018-07-05 ENCOUNTER — Other Ambulatory Visit: Payer: Self-pay | Admitting: Obstetrics & Gynecology

## 2018-07-05 ENCOUNTER — Ambulatory Visit (INDEPENDENT_AMBULATORY_CARE_PROVIDER_SITE_OTHER): Payer: 59

## 2018-07-05 ENCOUNTER — Encounter: Payer: Self-pay | Admitting: Obstetrics & Gynecology

## 2018-07-05 ENCOUNTER — Ambulatory Visit (INDEPENDENT_AMBULATORY_CARE_PROVIDER_SITE_OTHER): Payer: 59 | Admitting: Obstetrics & Gynecology

## 2018-07-05 DIAGNOSIS — Z30433 Encounter for removal and reinsertion of intrauterine contraceptive device: Secondary | ICD-10-CM | POA: Diagnosis not present

## 2018-07-05 DIAGNOSIS — Z30430 Encounter for insertion of intrauterine contraceptive device: Secondary | ICD-10-CM

## 2018-07-05 DIAGNOSIS — Z30432 Encounter for removal of intrauterine contraceptive device: Secondary | ICD-10-CM

## 2018-07-05 DIAGNOSIS — N854 Malposition of uterus: Secondary | ICD-10-CM | POA: Diagnosis not present

## 2018-07-05 NOTE — Patient Instructions (Signed)
1. Encounter for IUD removal and reinsertion Easily Mirena IUD removal without complication.  Well-tolerated.  Easy Mirena IUD insertion under ultrasound guidance.  No complication.  Well-tolerated by patient.  Precautions reviewed with patient.  We will follow-up in 4 weeks to confirm good location and no infection.  [name], it was a pleasure seeing you today!

## 2018-07-05 NOTE — Progress Notes (Signed)
    [name] 31-Oct-1970 [id number]        48 y.o.  G2P2L2  RP: Mirena IUD removal and insertion under US guidance  HPI: Time to change Mirena IUD.  No pelvic pain, no BTB, normal vaginal secretions.   OB History  Gravida Para Term Preterm AB Living  2 2       2   SAB TAB Ectopic Multiple Live Births               # Outcome Date GA Lbr Len/2nd Weight Sex Delivery Anes PTL Lv  2 Para           1 Para             Past medical history,surgical history, problem list, medications, allergies, family history and social history were all reviewed and documented in the EPIC chart.   Directed ROS with pertinent positives and negatives documented in the history of present illness/assessment and plan.  Exam:  There were no vitals filed for this visit. General appearance:  Normal                                                                    IUD procedure note       Patient presented to the office today for placement of Mirena IUD. The patient had previously been provided with literature information on this method of contraception. The risks benefits and pros and cons were discussed and all her questions were answered. She is fully aware that this form of contraception is 99% effective and is good for 5 years.  Pelvic US today: T/V and T/a images.  Retroverted uterus.  Current IUD noted in proper position.  Removed.  New IUD inserted under ultrasound guidance.  Noted in proper intrauterine position following procedure.  The cervix was cleansed with Betadine solution. Hurricane spray on the cervix.  A single-tooth tenaculum was placed on the anterior cervical lip. The IUD was shown to the patient and inserted in a sterile fashion under US guidance.  Hysterometry with the IUD as being inserted was 7 cm.  The IUD string was trimmed. The single-tooth tenaculum was removed.  The IUD location in the intra-uterine cavity was confirmed by [place]. Patient was instructed to return back to the office  in one month for follow up.        Assessment/Plan:  48 y.o. G2P2   1. Encounter for IUD removal and reinsertion Easily Mirena IUD removal without complication.  Well-tolerated.  Easy Mirena IUD insertion under ultrasound guidance.  No complication.  Well-tolerated by patient.  Precautions reviewed with patient.  We will follow-up in 4 weeks to confirm good location and no infection.  Princess Bruins MD, 8:56 AM 07/05/2018

## 2018-07-06 DIAGNOSIS — M7502 Adhesive capsulitis of left shoulder: Secondary | ICD-10-CM | POA: Diagnosis not present

## 2018-07-06 DIAGNOSIS — M25512 Pain in left shoulder: Secondary | ICD-10-CM | POA: Diagnosis not present

## 2018-07-06 DIAGNOSIS — M25612 Stiffness of left shoulder, not elsewhere classified: Secondary | ICD-10-CM | POA: Diagnosis not present

## 2018-07-09 ENCOUNTER — Encounter: Payer: Self-pay | Admitting: Anesthesiology

## 2018-07-09 DIAGNOSIS — M7502 Adhesive capsulitis of left shoulder: Secondary | ICD-10-CM | POA: Diagnosis not present

## 2018-07-09 DIAGNOSIS — M25612 Stiffness of left shoulder, not elsewhere classified: Secondary | ICD-10-CM | POA: Diagnosis not present

## 2018-07-09 DIAGNOSIS — M25512 Pain in left shoulder: Secondary | ICD-10-CM | POA: Diagnosis not present

## 2018-07-20 DIAGNOSIS — M25512 Pain in left shoulder: Secondary | ICD-10-CM | POA: Diagnosis not present

## 2018-07-20 DIAGNOSIS — M7502 Adhesive capsulitis of left shoulder: Secondary | ICD-10-CM | POA: Diagnosis not present

## 2018-07-20 DIAGNOSIS — M25612 Stiffness of left shoulder, not elsewhere classified: Secondary | ICD-10-CM | POA: Diagnosis not present

## 2018-07-23 DIAGNOSIS — M7502 Adhesive capsulitis of left shoulder: Secondary | ICD-10-CM | POA: Diagnosis not present

## 2018-07-23 DIAGNOSIS — M25512 Pain in left shoulder: Secondary | ICD-10-CM | POA: Diagnosis not present

## 2018-07-23 DIAGNOSIS — M25612 Stiffness of left shoulder, not elsewhere classified: Secondary | ICD-10-CM | POA: Diagnosis not present

## 2018-08-09 ENCOUNTER — Encounter: Payer: Self-pay | Admitting: Obstetrics & Gynecology

## 2018-08-09 ENCOUNTER — Ambulatory Visit (INDEPENDENT_AMBULATORY_CARE_PROVIDER_SITE_OTHER): Payer: 59 | Admitting: Obstetrics & Gynecology

## 2018-08-09 VITALS — BP 126/78 | Ht 62.75 in | Wt 127.0 lb

## 2018-08-09 DIAGNOSIS — Z30431 Encounter for routine checking of intrauterine contraceptive device: Secondary | ICD-10-CM

## 2018-08-09 DIAGNOSIS — L700 Acne vulgaris: Secondary | ICD-10-CM | POA: Diagnosis not present

## 2018-08-09 DIAGNOSIS — G44209 Tension-type headache, unspecified, not intractable: Secondary | ICD-10-CM | POA: Diagnosis not present

## 2018-08-09 NOTE — Progress Notes (Signed)
    [name] [birth year] [id number]        48 y.o.  G2P2L2 Married   RP: Mirena IUD check 4 weeks post insertion  HPI: Mirena IUD inserted 4 weeks ago with intermittent vaginal spotting and cramping.  Tolerating the IUD well so far.  No pain with intercourse.  No fever.  Complains of acne and headaches since Mirena IUD insertion.  No increase in hair growth.  Headaches were controlled with over-the-counter medications.   OB History  Gravida Para Term Preterm AB Living  2 2       2   SAB TAB Ectopic Multiple Live Births               # Outcome Date GA Lbr Len/2nd Weight Sex Delivery Anes PTL Lv  2 Para           1 Para             Past medical history,surgical history, problem list, medications, allergies, family history and social history were all reviewed and documented in the EPIC chart.   Directed ROS with pertinent positives and negatives documented in the history of present illness/assessment and plan.  Exam:  Vitals:   08/09/18 1507  BP: 126/78  Weight: 127 lb (57.6 kg)  Height: 5' 2.75" (1.594 m)   General appearance:  Normal  Abdomen: Normal  Gynecologic exam: Vulva normal.  Speculum: Cervix and vagina normal.  IUD strings visible at the exocervix.  Normal vaginal secretions and no blood.   Assessment/Plan:  48 y.o. G2P2   1. IUD check up Mirena IUD in good location and well-tolerated.  No signs of infection.  2. Acne vulgaris Increased acne since [place] IUD insertion, possibly related to progesterone.  Will rule out increase in testosterone.  No hirsutism. - Testos,Total,Free and SHBG (Female)  3. Acute non intractable tension-type headache Tension headaches controlled with over-the-counter medications.  Will rule out menopause and vitamin D deficiency. - FSH - VITAMIN D 25 Hydroxy (Vit-D Deficiency, Fractures) - Testos,Total,Free and SHBG (Female)  Counseling on above issues and coordination of care more than 50% for 15 minutes.  Princess Bruins MD, 3:10 PM 08/09/2018

## 2018-08-09 NOTE — Patient Instructions (Signed)
1. IUD check up Mirena IUD in good location and well-tolerated.  No signs of infection.  2. Acne vulgaris Increased acne since [place] IUD insertion, possibly related to progesterone.  Will rule out increase in testosterone.  No hirsutism. - Testos,Total,Free and SHBG (Female)  3. Acute non intractable tension-type headache Tension headaches controlled with over-the-counter medications.  Will rule out menopause and vitamin D deficiency. - FSH - VITAMIN D 25 Hydroxy (Vit-D Deficiency, Fractures) - Testos,Total,Free and SHBG (Female)  [place], it was a pleasure seeing you today!  I will inform you of your results as soon as they are available.

## 2018-08-10 ENCOUNTER — Other Ambulatory Visit: Payer: Self-pay | Admitting: *Deleted

## 2018-08-10 DIAGNOSIS — E559 Vitamin D deficiency, unspecified: Secondary | ICD-10-CM

## 2018-08-10 MED ORDER — VITAMIN D (ERGOCALCIFEROL) 1.25 MG (50000 UNIT) PO CAPS: 50000.0000 [IU] | ORAL_CAPSULE | ORAL | 0 refills | Status: DC

## 2018-08-12 LAB — TESTOS,TOTAL,FREE AND SHBG (FEMALE)
Free Testosterone: 1 pg/mL (ref 0.1–6.4)
Sex Hormone Binding: 126 nmol/L — ABNORMAL HIGH (ref 17–124)
Testosterone, Total, LC-MS-MS: 22 ng/dL (ref 2–45)

## 2018-08-12 LAB — FOLLICLE STIMULATING HORMONE: FSH: 9.6 m[IU]/mL

## 2018-08-12 LAB — VITAMIN D 25 HYDROXY (VIT D DEFICIENCY, FRACTURES): Vit D, 25-Hydroxy: 21 ng/mL — ABNORMAL LOW (ref 30–100)

## 2018-09-21 DIAGNOSIS — M0579 Rheumatoid arthritis with rheumatoid factor of multiple sites without organ or systems involvement: Secondary | ICD-10-CM | POA: Diagnosis not present

## 2018-09-21 DIAGNOSIS — R7989 Other specified abnormal findings of blood chemistry: Secondary | ICD-10-CM | POA: Diagnosis not present

## 2018-10-04 DIAGNOSIS — M0579 Rheumatoid arthritis with rheumatoid factor of multiple sites without organ or systems involvement: Secondary | ICD-10-CM | POA: Diagnosis not present

## 2018-10-04 DIAGNOSIS — M2550 Pain in unspecified joint: Secondary | ICD-10-CM | POA: Diagnosis not present

## 2018-10-08 DIAGNOSIS — M0579 Rheumatoid arthritis with rheumatoid factor of multiple sites without organ or systems involvement: Secondary | ICD-10-CM | POA: Diagnosis not present

## 2018-10-09 ENCOUNTER — Ambulatory Visit (INDEPENDENT_AMBULATORY_CARE_PROVIDER_SITE_OTHER): Payer: 59

## 2018-10-09 ENCOUNTER — Ambulatory Visit (INDEPENDENT_AMBULATORY_CARE_PROVIDER_SITE_OTHER): Payer: 59 | Admitting: Podiatry

## 2018-10-09 ENCOUNTER — Other Ambulatory Visit: Payer: Self-pay

## 2018-10-09 ENCOUNTER — Encounter: Payer: Self-pay | Admitting: Podiatry

## 2018-10-09 VITALS — Temp 97.3°F

## 2018-10-09 DIAGNOSIS — M778 Other enthesopathies, not elsewhere classified: Secondary | ICD-10-CM

## 2018-10-09 DIAGNOSIS — M779 Enthesopathy, unspecified: Secondary | ICD-10-CM

## 2018-10-10 ENCOUNTER — Encounter: Payer: Self-pay | Admitting: Podiatry

## 2018-10-10 NOTE — Progress Notes (Signed)
Subjective:  Patient ID: [name], female    DOB: 24-Dec-1970,  MRN: [id number] HPI Chief Complaint  Patient presents with  . Foot Pain    Plantar and dorsal forefoot right - tender, numbness x few weeks, treated for neuroma about 12 years ago, tried ice and rest    48 y.o. female presents with the above complaint.   ROS: Denies fever chills nausea vomiting muscle aches pains calf pain back pain chest pain shortness of breath.  Past Medical History:  Diagnosis Date  . Asthma   . Fibromyalgia   . Functional ovarian cysts   . GERD (gastroesophageal reflux disease)   . Headache    migraine  . Hypertension   . Pneumothorax   . RA (rheumatoid arthritis) ([place])    Past Surgical History:  Procedure Laterality Date  . CHOLECYSTECTOMY    . ovarian tumor    . TONSILLECTOMY      Current Outpatient Medications:  .  cetirizine (ZYRTEC) 10 MG tablet, Take 10 mg by mouth daily., Disp: , Rfl:  .  citalopram (CELEXA) 20 MG tablet, Take 20 mg by mouth daily., Disp: , Rfl:  .  Etanercept (ENBREL [place]), Inject into the skin., Disp: , Rfl:  .  levonorgestrel (MIRENA) 20 MCG/24HR IUD, 1 each by Intrauterine route once., Disp: , Rfl:  .  LORazepam (ATIVAN) 0.5 MG tablet, Take 0.5 mg by mouth 2 (two) times daily as needed for anxiety., Disp: , Rfl:  .  omeprazole (PRILOSEC) 40 MG capsule, Take 40 mg by mouth daily., Disp: , Rfl:  .  primidone (MYSOLINE) 250 MG tablet, Take 250 mg by mouth daily., Disp: , Rfl:  .  Vitamin D, Ergocalciferol, (DRISDOL) 1.25 MG (50000 UT) CAPS capsule, Take 1 capsule (50,000 Units total) by mouth every 7 (seven) days., Disp: 12 capsule, Rfl: 0  Allergies  Allergen Reactions  . Ciprofloxacin   . Codeine   . Diclofenac Sodium   . Doxycycline Hyclate   . Erythromycin   . Iodine   . Levaquin [Levofloxacin In D5w]   . Nubain [Nalbuphine Hcl]   . Zithromax [Azithromycin]   . Penicillins Rash   Review of Systems Objective:   Vitals:   10/09/18 1205  Temp:  (!) 97.3 F (36.3 C)    General: Well developed, nourished, in no acute distress, alert and oriented x3   Dermatological: Skin is warm, dry and supple bilateral. Nails x 10 are well maintained; remaining integument appears unremarkable at this time. There are no open sores, no preulcerative lesions, no rash or signs of infection present.  Vascular: Dorsalis Pedis artery and Posterior Tibial artery pedal pulses are 2/4 bilateral with immedate capillary fill time. Pedal hair growth present. No varicosities and no lower extremity edema present bilateral.   Neruologic: Grossly intact via light touch bilateral. Vibratory intact via tuning fork bilateral. Protective threshold with Semmes Wienstein monofilament intact to all pedal sites bilateral. Patellar and Achilles deep tendon reflexes 2+ bilateral. No Babinski or clonus noted bilateral.   Musculoskeletal: No gross boney pedal deformities bilateral. No pain, crepitus, or limitation noted with foot and ankle range of motion bilateral. Muscular strength 5/5 in all groups tested bilateral.  She has pain on palpation to the second interdigital space of the right foot.  She also has pain on end range of motion of the second metatarsal phalangeal joint.  Gait: Unassisted, Nonantalgic.    Radiographs:  Radiographs taken today demonstrate no acute abnormalities.  She does have very close  metatarsal heads and some mild separation between the second and third toes.  Assessment & Plan:   Assessment: Capsulitis second metatarsal phalangeal joint cannot rule out neuroma.  Plan: After sterile Betadine skin prep I injected 10 mg of Kenalog 5 mg Marcaine point of maximal tenderness second interdigital space right foot.  She will follow-up with [place] on an as-needed basis discussed appropriate shoe gear stretching exercise ice therapy and shoe gear modifications.     Sahej Hauswirth T. [place], [place]

## 2018-10-30 ENCOUNTER — Other Ambulatory Visit: Payer: Self-pay | Admitting: Obstetrics & Gynecology

## 2018-12-30 ENCOUNTER — Telehealth: Payer: Self-pay | Admitting: Gastroenterology

## 2018-12-30 NOTE — Telephone Encounter (Signed)
Patty, Can you contact her. She is a friend of CRNA Josh Monday from [facility], has a history of BArrett's esophagus.  Needs NGI appt with me, first available (but double book if nothing within 3 weeks).  See if you can get records from any previous EGDs, biopsy reports associated.  Thanks  DJ

## 2018-12-31 NOTE — Telephone Encounter (Signed)
FYI Dr Ardis Hughs I offered several appt dates but the pt was unable to make any of those. She is going out of town for several weeks.  She choose to make an appt for 8/28.

## 2019-01-01 ENCOUNTER — Other Ambulatory Visit: Payer: Self-pay

## 2019-01-01 ENCOUNTER — Encounter: Payer: Self-pay | Admitting: Podiatry

## 2019-01-01 ENCOUNTER — Ambulatory Visit (INDEPENDENT_AMBULATORY_CARE_PROVIDER_SITE_OTHER): Payer: 59

## 2019-01-01 ENCOUNTER — Ambulatory Visit (INDEPENDENT_AMBULATORY_CARE_PROVIDER_SITE_OTHER): Payer: 59 | Admitting: Podiatry

## 2019-01-01 VITALS — Temp 98.3°F

## 2019-01-01 DIAGNOSIS — M7672 Peroneal tendinitis, left leg: Secondary | ICD-10-CM

## 2019-01-01 DIAGNOSIS — M7752 Other enthesopathy of left foot: Secondary | ICD-10-CM

## 2019-01-01 DIAGNOSIS — M7750 Other enthesopathy of unspecified foot: Secondary | ICD-10-CM

## 2019-01-01 MED ORDER — METHYLPREDNISOLONE 4 MG PO TBPK: ORAL_TABLET | ORAL | 0 refills | Status: DC

## 2019-01-01 NOTE — Progress Notes (Signed)
She presents today states that she was being very active playing volleyball without shoes on states that the forefoot of the right foot is doing much better however she may have injured her left lateral ankle as she points to the posterior portion of her left ankle while playing volleyball.  She states that really did not swell and it never turned black and blue.  Objective: Vital signs are stable she is alert and oriented x3 pulses are strongly palpable left foot.  Peroneal tendons are fully functional do not appear to be prolapsing but on palpation there is obvious fluid in the peroneal tendon sheath just inferior to the lateral malleolus.  She also has tenderness on palpation of the peroneal retinaculum.  Radiographs do not demonstrate any type of osseous abnormalities in this area.  She does have some soft tissue swelling of the Achilles.  Assessment: Peroneal tendinitis left.  Plan: Discussed etiology pathology and surgical therapies start her on a Medrol Dosepak inject the area today with 2 mg of dexamethasone and local anesthetic she tolerated procedure well without complications and I recommended that she discontinue high-impact activities follow up with me in about a month if not improved.

## 2019-01-07 ENCOUNTER — Telehealth: Payer: Self-pay | Admitting: *Deleted

## 2019-01-07 NOTE — Telephone Encounter (Signed)
Pt states she received her 3rd injection and taking the medrol dose pack, but the foot I worse.

## 2019-01-07 NOTE — Telephone Encounter (Signed)
Consider MRI if not improving.

## 2019-01-08 ENCOUNTER — Ambulatory Visit (INDEPENDENT_AMBULATORY_CARE_PROVIDER_SITE_OTHER): Payer: 59 | Admitting: Podiatry

## 2019-01-08 ENCOUNTER — Encounter: Payer: Self-pay | Admitting: Podiatry

## 2019-01-08 ENCOUNTER — Other Ambulatory Visit: Payer: Self-pay

## 2019-01-08 DIAGNOSIS — M7672 Peroneal tendinitis, left leg: Secondary | ICD-10-CM | POA: Diagnosis not present

## 2019-01-08 DIAGNOSIS — M79671 Pain in right foot: Secondary | ICD-10-CM | POA: Diagnosis not present

## 2019-01-08 NOTE — Progress Notes (Signed)
Patient is still having pain in left foot. Dr. Milinda Pointer recommended she have an MRI. She would like to avoid that at this time. She wanted something to keep her foot flexed, so Dr. Milinda Pointer offered her a night splint and a cam boot to try first.  Patient is here for her fitting today.

## 2019-01-08 NOTE — Telephone Encounter (Signed)
I informed pt of Dr. Stephenie Acres recommendation for the MRI. Pt states it feels better flexed out and hurts when relaxed and after resting at night, and asked is there anything for that keeping the foot flexed. I told pt Dr. Milinda Pointer may order a cam boot and night splint if she would like to try prior to the MRI. Pt states she would like to try prior to the MRI and can drop by today at 4:30pm.

## 2019-01-25 ENCOUNTER — Telehealth: Payer: Self-pay

## 2019-01-25 ENCOUNTER — Telehealth: Payer: Self-pay | Admitting: Gastroenterology

## 2019-01-25 NOTE — Telephone Encounter (Signed)
I spoke to patient to remind her of her upcoming appointment with Dr Ardis Hughs 02/01/19 and records from her previous GI Doctor. She was seen at [facility] and diagnosed with Barretts esophagus. I called the office and spoke with medical records who stated they will fax over EGD and path reports. A record release has been emailed to patient as well. She will have Dakota who works on the the 4th floor return the form to [place] on Monday if needed.

## 2019-01-25 NOTE — Telephone Encounter (Signed)
She has an appointment with me next week and I think she was trying to have some previous GI records sent here but she was told that we need to request them from her other provider.  Can you contact her to find out how we can help get the records here before her appointment next week.  Thank you very much

## 2019-01-25 NOTE — Telephone Encounter (Signed)
Left message on machine to call back  

## 2019-01-28 NOTE — Telephone Encounter (Signed)
I spoke with the pt and she advised that a release form was dropped off at our office today for records from [facility].  I will get the release and fax today.

## 2019-01-29 ENCOUNTER — Ambulatory Visit (INDEPENDENT_AMBULATORY_CARE_PROVIDER_SITE_OTHER): Payer: 59 | Admitting: Podiatry

## 2019-01-29 ENCOUNTER — Other Ambulatory Visit: Payer: Self-pay

## 2019-01-29 ENCOUNTER — Ambulatory Visit (INDEPENDENT_AMBULATORY_CARE_PROVIDER_SITE_OTHER): Payer: 59

## 2019-01-29 ENCOUNTER — Encounter: Payer: Self-pay | Admitting: Podiatry

## 2019-01-29 VITALS — Temp 97.6°F

## 2019-01-29 DIAGNOSIS — M84375A Stress fracture, left foot, initial encounter for fracture: Secondary | ICD-10-CM | POA: Diagnosis not present

## 2019-01-30 ENCOUNTER — Encounter: Payer: Self-pay | Admitting: Podiatry

## 2019-01-30 ENCOUNTER — Encounter

## 2019-01-30 NOTE — Progress Notes (Signed)
She presents today with pain to the dorsal forefoot left states is been swelling and aching 6 August 9 and they went to [place] and she did a lot of walking in the sand.  She states that she is not sure of any injury.  She has been wearing her cam boot states that it really hurts her to even try to tolerate it.  She denies any calf pain back pain chest pain shortness of breath.  Denies change in her past medical history medications allergies.  Objective: Vital signs are stable she is alert and oriented x3.  Pulses are palpable.  Neurologic sensorium is intact deep tendon reflexes are intact muscle strength is normal symmetrical.  She has tenderness on range of motion of the toes she has swelling to the forefoot she has pain on direct palpation of the second metatarsal.  No ecchymosis no cellulitis drainage or odor no open lesions or wounds no cuts or nicks.  Radiographs taken today demonstrate an osseous immature individual with what appears to be a small area of periostitis to the medial aspect of the neck of the second metatarsal most likely consistent with stress fracture.  Assessment: Probable stress fracture second metatarsal left foot.  Plan: Placed her in a Darco shoe encouraged her to wear the Darco shoe wear the cam walker.  Follow-up with her in 3 weeks for another set of x-rays

## 2019-01-31 ENCOUNTER — Ambulatory Visit: Payer: 59 | Admitting: Podiatry

## 2019-02-01 ENCOUNTER — Encounter (INDEPENDENT_AMBULATORY_CARE_PROVIDER_SITE_OTHER): Payer: Self-pay

## 2019-02-01 ENCOUNTER — Encounter: Payer: Self-pay | Admitting: Gastroenterology

## 2019-02-01 ENCOUNTER — Ambulatory Visit (INDEPENDENT_AMBULATORY_CARE_PROVIDER_SITE_OTHER): Payer: 59 | Admitting: Gastroenterology

## 2019-02-01 VITALS — BP 110/84 | HR 84 | Temp 98.1°F | Ht 63.0 in | Wt 135.5 lb

## 2019-02-01 DIAGNOSIS — K219 Gastro-esophageal reflux disease without esophagitis: Secondary | ICD-10-CM

## 2019-02-01 NOTE — Progress Notes (Signed)
HPI: This is a very pleasant 48 year old woman who was referred to me by Cari Caraway, MD  to evaluate chronic GERD, possible history of Barrett's,.    Chief complaint is chronic GERD, possible history of Barrett's  She underwent an upper endoscopy with Dr. Harrell Lark [facility] when she was 48 years old May 2009.  This was done for "persistent upper abdominal pain".  He described "mild esophagitis was notable at the distal 4 cm of the esophagus.  Biopsy was taken from 2 and 4 cm above the GE junction to rule out Barrett's esophagus" pathology which I reviewed 2 cm above the GE junction was reported as "chronic inflammation of gastric type mucosa unassociated with intestinal metaplasia" biopsy of 4 cm above the GE junction was reported as "chronic inflammation of gastric type mucosa exhibiting focal intestinal metaplasia consistent with Barrett's esophagus" Pathology report from EGD May 2011 esophagus 2 cm above the GE junction showed "chronic inflammation of gastric type mucosa on associated with intestinal metaplasia" esophagus 4 cm above the GE junction pathologically was exactly the same.  He repeated EGD 10/2011 he took at least 10 different bottles worth of biopsies.  It looks like every 2 cm starting at the GE junction and ending 15 cm from the incisors.  Barrett's esophagus was never described in fact it was specifically stated that she did not have Barrett's esophagus.  He repeated EGD less than 1 year later March 2016 pathology reports stated "inflamed gastric type mucosa with no intestinal metaplasia (no Barrett's esophagus)" this was the same at 1 cm and also at 5 cm above the GE junction.  He recommended repeat EGD March 2019.   Ultrasound March 2016 indication "pain" findings status post cholecystectomy, multiple bilateral renal calculi. Ultrasound November 2018 indication "pain" findings status post cholecystectomy, multiple bilateral renal calculi  She  underwent a colonoscopy 6 or 8 years ago and it was completely normal.  She was told to have a repeat at 5-year interval.   She does have GERD symptoms.  Several coffees every day.  She takes Prilosec twice daily first pills with her coffee in the morning and the second pills at bedtime every night.  She has regurg, chronic GERD, chronic pyrosis.  No dysphasia.  She has gained about 20 pounds in the past year or 2  Old Data Reviewed:     Review of systems: Pertinent positive and negative review of systems were noted in the above HPI section. All other review negative.   Past Medical History:  Diagnosis Date  . Anxiety   . Asthma   . Barrett's esophagus   . Fibromyalgia   . Functional ovarian cysts   . GERD (gastroesophageal reflux disease)   . Headache    migraine  . Hypertension   . Pneumothorax   . RA (rheumatoid arthritis) ([place])     Past Surgical History:  Procedure Laterality Date  . CHOLECYSTECTOMY    . ovarian tumor    . TONSILLECTOMY      Current Outpatient Medications  Medication Sig Dispense Refill  . Ascorbic Acid (VITAMIN C) 1000 MG tablet Take 1,000 mg by mouth daily.    . cetirizine (ZYRTEC) 10 MG tablet Take 10 mg by mouth daily.    . citalopram (CELEXA) 20 MG tablet Take 20 mg by mouth daily.    . Etanercept (ENBREL [place]) Inject into the skin.    . fluticasone (FLONASE) 50 MCG/ACT nasal spray Place 2 sprays into both  nostrils daily.    [name] Kitchen levonorgestrel (MIRENA) 20 MCG/24HR IUD 1 each by Intrauterine route once.    [name] Kitchen LORazepam (ATIVAN) 0.5 MG tablet Take 0.5 mg by mouth 2 (two) times daily as needed for anxiety.    . methylPREDNISolone (MEDROL DOSEPAK) 4 MG TBPK tablet 6 day dose pack - take as directed 21 tablet 0  . omeprazole (PRILOSEC) 40 MG capsule Take 40 mg by mouth daily.    . primidone (MYSOLINE) 250 MG tablet Take 250 mg by mouth daily.    [name] Kitchen VITAMIN D PO Take 5,000-10,000 Units by mouth daily.     No current facility-administered medications  for this visit.     Allergies as of 02/01/2019 - Review Complete 02/01/2019  Allergen Reaction Noted  . Ciprofloxacin  07/25/2016  . Codeine  07/25/2016  . Diclofenac sodium  07/25/2016  . Doxycycline hyclate  07/25/2016  . Erythromycin  07/25/2016  . Iodine  07/25/2016  . Levaquin [levofloxacin in d5w]  07/25/2016  . Nubain [nalbuphine hcl]  07/25/2016  . Zithromax [azithromycin]  07/25/2016  . Penicillins Rash 07/25/2016    Family History  Problem Relation Age of Onset  . Rheumatologic disease Father   . Tremor Father   . Colon polyps Father   . Heart attack Paternal Grandfather   . Colon cancer Maternal Uncle        died 32  . Colon cancer Paternal Grandmother        died 63  . Colon polyps Paternal Aunt     Social History   Socioeconomic History  . Marital status: Married    Spouse name: Not on file  . Number of children: 2  . Years of education: Not on file  . Highest education level: Not on file  Occupational History  . Occupation: Nurse, children's  Social Needs  . Financial resource strain: Not on file  . Food insecurity    Worry: Not on file    Inability: Not on file  . Transportation needs    Medical: Not on file    Non-medical: Not on file  Tobacco Use  . Smoking status: Never Smoker  . Smokeless tobacco: Never Used  Substance and Sexual Activity  . Alcohol use: Yes    Alcohol/week: 3.0 standard drinks    Types: 3 Glasses of wine per week    Comment: 3 GLASSES A WEEK   . Drug use: No  . Sexual activity: Yes    Partners: Male    Birth control/protection: I.U.D.    Comment: 1st intercourse- 80, partners- 66, married- 80 yrs   Lifestyle  . Physical activity    Days per week: Not on file    Minutes per session: Not on file  . Stress: Not on file  Relationships  . Social Herbalist on phone: Not on file    Gets together: Not on file    Attends religious service: Not on file    Active member of club or organization: Not on  file    Attends meetings of clubs or organizations: Not on file    Relationship status: Not on file  . Intimate partner violence    Fear of current or ex partner: Not on file    Emotionally abused: Not on file    Physically abused: Not on file    Forced sexual activity: Not on file  Other Topics Concern  . Not on file  Social History Narrative  . Not on file  Physical Exam: BP 110/84 (BP Location: Left Arm, Patient Position: Sitting, Cuff Size: Normal)   Pulse 84   Temp 98.1 F (36.7 C)   Ht 5\' 3"  (1.6 m) Comment: height measured without shoes  Wt 135 lb 8 oz (61.5 kg)   BMI 24.00 kg/m  Constitutional: generally well-appearing Psychiatric: alert and oriented x3 Eyes: extraocular movements intact Mouth: oral pharynx moist, no lesions Neck: supple no lymphadenopathy Cardiovascular: heart regular rate and rhythm Lungs: clear to auscultation bilaterally Abdomen: soft, nontender, nondistended, no obvious ascites, no peritoneal signs, normal bowel sounds Extremities: no lower extremity edema bilaterally Skin: no lesions on visible extremities   Assessment and plan: 48 y.o. female with chronic GERD   she has had 4 upper endoscopies by advice of Dr. Harrell Lark over the past decade or so.  Extensive biopsies up and down the esophagus multiple times.  On a single occasion )2009) at 4 cm from the GE junction the pathologist reported "chronic inflammation of gastric type mucosa exhibiting focal intestinal metaplasia consistent with Barrett's esophagus". Whereas on the other 20 or 30 pathologic specimens over about 10 years time it was specifically noted by pathology that she had no intestinal metaplasia.  I do not think she in truth has Barrett's esophagus.  I am frankly astounded by how often Dr. Ferdinand Lango was having her come back and perform an egd.  She clearly does have GERD however no alarm symptoms.  I recommended she change the way she is taking her proton pump inhibitor so that  she is taking it 20 to 30 minutes prior to breakfast and dinner meals.  That is going to be hard for her because she only drinks coffee in the morning and she is not going to budge about that.  I did recommend she try to cut back on caffeine as well.  She will call to report on her response to this change in her medicine regimen.  Her previous gastroenterologist declined to prescribe any further antiacid medicines for her unless she would have a repeat EGD which I I do not think she needs anyway.  I am happy to take this over for her.  We will reach out to his office again and see if we can get her previous colonoscopy report sent here for review.  I think her next one should be at 48 years old.   Please see the "Patient Instructions" section for addition details about the plan.   Owens Loffler, MD [facility] 02/01/2019, 3:37 PM  Cc: Cari Caraway, MD

## 2019-02-01 NOTE — Patient Instructions (Addendum)
You will be due for your first screening colonoscopy in 01/2023. We will send you a reminder in the mail when it gets closer to that time.  We have sent the following medications to your pharmacy for you to pick up at your convenience: Omeprazole 40 mg twice daily, 30 minutes before breakfast and 30 minutes before dinner.  Please call our office back in 8 weeks to let us know how you are doing on the omeprazole. Our office phone number is [phone].  We will reach out to [facility] again to try to obtain the colonoscopy report we havent received yet.  If you are age 16 or younger, your body mass index should be between 19-25. Your Body mass index is 24 kg/m. If this is out of the aformentioned range listed, please consider follow up with your Primary Care Provider.

## 2019-02-12 ENCOUNTER — Telehealth: Payer: Self-pay | Admitting: Gastroenterology

## 2019-02-12 NOTE — Telephone Encounter (Signed)
I reviewed the colonoscopy report from Dr. Harrell Lark dated March 2016.  Indication "family history of colon cancer, abdominal pain, constipation".  Findings no polyps.  Bowel prep "inadequate" view of colonic mucosa "inadequate" description of colonic mucosa unable to view secondary to "inadequate or poor prep" he recommended she have a repeat colonoscopy at 5 years    Please tell her her that I think she should have a colonoscopy around her 52th birthday, July or August 2024.  I believe she was already put in for that recall.  She does not have a significant enough family history to do it sooner than that.

## 2019-02-12 NOTE — Telephone Encounter (Signed)
Spoke to patient to inform her that Dr Ardis Hughs reviewed Dr Ferdinand Lango 2016 colon report. He recommends a colonoscopy at age 48. A recall is placed in the system. All questions answered.

## 2019-02-19 ENCOUNTER — Ambulatory Visit: Payer: 59 | Admitting: Podiatry

## 2019-02-21 ENCOUNTER — Ambulatory Visit (INDEPENDENT_AMBULATORY_CARE_PROVIDER_SITE_OTHER): Payer: 59

## 2019-02-21 ENCOUNTER — Other Ambulatory Visit: Payer: Self-pay

## 2019-02-21 ENCOUNTER — Ambulatory Visit (INDEPENDENT_AMBULATORY_CARE_PROVIDER_SITE_OTHER): Payer: 59 | Admitting: Podiatry

## 2019-02-21 DIAGNOSIS — M84375D Stress fracture, left foot, subsequent encounter for fracture with routine healing: Secondary | ICD-10-CM | POA: Diagnosis not present

## 2019-02-21 DIAGNOSIS — M7662 Achilles tendinitis, left leg: Secondary | ICD-10-CM

## 2019-02-21 MED ORDER — MELOXICAM 15 MG PO TABS: 15.0000 mg | ORAL_TABLET | Freq: Every day | ORAL | 3 refills | Status: DC

## 2019-02-21 NOTE — Progress Notes (Signed)
She presents today for follow-up of her fracture second metatarsal left foot.  She is also complaining of her Achilles bursitis left foot.  States that with tennis shoes really started to aggravate the Achilles bursitis.  Objective: Vital signs are stable she is alert and oriented x3.  Pulses are palpable.  Still has edema across the dorsal aspect of the forefoot left.  Radiographs taken today do demonstrate a healing bone callus of the neck of the metatarsal second left.  She has a palpable bursitis of the pre-Achilles area.  Assessment: Bursitis Achilles and fracture second metatarsal left.  Plan: At this point I recommended she continue her anti-inflammatories meloxicam also recommended dexamethasone injection to the Achilles area 2 mg was injected to the bursa making sure not to inject into the tendon itself.  She is going to continue the use of the Darco shoe will follow-up with her in 4 to 6 weeks.

## 2019-02-26 ENCOUNTER — Ambulatory Visit: Payer: 59 | Admitting: Podiatry

## 2019-03-14 ENCOUNTER — Ambulatory Visit (INDEPENDENT_AMBULATORY_CARE_PROVIDER_SITE_OTHER): Payer: 59

## 2019-03-14 ENCOUNTER — Telehealth: Payer: Self-pay | Admitting: *Deleted

## 2019-03-14 ENCOUNTER — Ambulatory Visit (INDEPENDENT_AMBULATORY_CARE_PROVIDER_SITE_OTHER): Payer: 59 | Admitting: Podiatry

## 2019-03-14 ENCOUNTER — Other Ambulatory Visit: Payer: Self-pay

## 2019-03-14 ENCOUNTER — Encounter: Payer: Self-pay | Admitting: Podiatry

## 2019-03-14 DIAGNOSIS — M84375D Stress fracture, left foot, subsequent encounter for fracture with routine healing: Secondary | ICD-10-CM

## 2019-03-14 DIAGNOSIS — M7662 Achilles tendinitis, left leg: Secondary | ICD-10-CM

## 2019-03-14 DIAGNOSIS — S86012A Strain of left Achilles tendon, initial encounter: Secondary | ICD-10-CM | POA: Diagnosis not present

## 2019-03-14 MED ORDER — METHYLPREDNISOLONE 4 MG PO TBPK: ORAL_TABLET | ORAL | 0 refills | Status: DC

## 2019-03-14 MED ORDER — CELECOXIB 100 MG PO CAPS: 100.0000 mg | ORAL_CAPSULE | Freq: Two times a day (BID) | ORAL | 3 refills | Status: DC

## 2019-03-14 NOTE — Progress Notes (Signed)
She presents today for follow-up of stress fracture second metatarsal left foot states that is doing much better.  She is also following up for severely painful bursitis to her Achilles left.  States that I am having a lot of pain and cannot even wear the cam boot because it hurts too bad.  Objective: Vital signs are stable she is alert and oriented x3.  Pulses are palpable.  No swelling of the second metatarsal area of the left foot.  However she does have severe pain on palpation of the superior posterior calcaneus with fluctuance beneath the skin most likely bursitis but she is also having pain medially and laterally consistent with Achilles tendinitis.  Assessment: Chronic intractable Achilles tendinitis bursitis cannot rule out a tear to the Achilles.  Plan: At this point would like to have an MRI for surgical consideration.

## 2019-03-14 NOTE — Telephone Encounter (Signed)
-----   Message from Rip Harbour, [facility] sent at 03/14/2019  9:58 AM EDT ----- Regarding: MRI MRI left ankle - evaluate achilles tendonitis with possible tear left - surgical consideration

## 2019-03-14 NOTE — Telephone Encounter (Signed)
Faxed orders to [place], gave to L. Cox, CMA for pre-cert.

## 2019-03-18 ENCOUNTER — Telehealth: Payer: Self-pay | Admitting: *Deleted

## 2019-03-18 NOTE — Telephone Encounter (Signed)
Called and spoke with [employer]) and spoke with Carmelia Roller and the representative stated that the procedure code [id number] was approved and the authorization number [id number] and was valid 03/18/2019 thru 09/14/2019. Lattie Haw

## 2019-03-19 ENCOUNTER — Ambulatory Visit: Payer: 59 | Admitting: Podiatry

## 2019-03-21 ENCOUNTER — Ambulatory Visit: Payer: 59 | Admitting: Podiatry

## 2019-03-30 ENCOUNTER — Ambulatory Visit
Admission: RE | Admit: 2019-03-30 | Discharge: 2019-03-30 | Disposition: A | Discharge status: Home / Self Care | Payer: 59 | Source: Ambulatory Visit | Attending: Podiatry | Admitting: Podiatry

## 2019-03-30 ENCOUNTER — Other Ambulatory Visit: Payer: Self-pay

## 2019-03-31 ENCOUNTER — Other Ambulatory Visit: Payer: 59

## 2019-04-01 ENCOUNTER — Telehealth: Payer: Self-pay | Admitting: *Deleted

## 2019-04-01 NOTE — Telephone Encounter (Signed)
-----   Message from Garrel Ridgel, [place] sent at 04/01/2019 12:37 PM EDT ----- She could try PT or surgery would be the only other option.  Achilles tendinitis and small tears are causing the pain.

## 2019-04-01 NOTE — Telephone Encounter (Signed)
I informed pt of Dr. Stephenie Acres review of results and options. Pt states she would like to come in to discuss with Dr. Milinda Pointer.

## 2019-04-03 ENCOUNTER — Encounter: Payer: Self-pay | Admitting: Podiatry

## 2019-04-03 ENCOUNTER — Ambulatory Visit (INDEPENDENT_AMBULATORY_CARE_PROVIDER_SITE_OTHER): Payer: 59 | Admitting: Podiatry

## 2019-04-03 ENCOUNTER — Other Ambulatory Visit: Payer: Self-pay

## 2019-04-03 DIAGNOSIS — M7662 Achilles tendinitis, left leg: Secondary | ICD-10-CM | POA: Diagnosis not present

## 2019-04-03 MED ORDER — NITROGLYCERIN 0.2 MG/HR TD PT24: 0.2000 mg | MEDICATED_PATCH | Freq: Every day | TRANSDERMAL | 12 refills | Status: DC

## 2019-04-03 NOTE — Progress Notes (Signed)
She presents today following up her stress fracture dorsal aspect left foot as well as her MRI left foot for her Achilles.  She states that is still tender and she wants to get back to her activities.  Objective: Vital signs are stable she is alert oriented x3.  Pulses are palpable.  Still has tenderness on palpation of the Achilles on the dorsal aspect of the foot.  MRI does indicate that she has peritendinosis as well as distal tendinosis of the Achilles.  Assessment: Well-healing stress fracture dorsal aspect of the left foot fourth metatarsal and Achilles tendinosis left.  Plan: Start aggressive physical therapy with her Achilles tendon at [facility] physical therapy and also started her on a 2 mg patch for nitro to be placed over her Achilles.

## 2019-04-04 ENCOUNTER — Telehealth: Payer: Self-pay | Admitting: *Deleted

## 2019-04-04 NOTE — Telephone Encounter (Signed)
-----   Message from Rip Harbour, [facility] sent at 04/03/2019  3:35 PM EDT ----- Regarding: PT PT referral - [facility] - Evaluate and treat for achilles tendonitis left x 6 weeks  Dr. Milinda Pointer wanted you to set up for her since this was a [facility] patient. Thank you!

## 2019-04-04 NOTE — Telephone Encounter (Signed)
Required form, clinicals and demographics faxed to [facility].

## 2019-04-30 DIAGNOSIS — M12519 Traumatic arthropathy, unspecified shoulder: Secondary | ICD-10-CM | POA: Insufficient documentation

## 2019-05-14 ENCOUNTER — Ambulatory Visit: Payer: 59 | Admitting: Podiatry

## 2019-05-22 ENCOUNTER — Telehealth: Payer: Self-pay | Admitting: Gastroenterology

## 2019-05-22 NOTE — Telephone Encounter (Signed)
Per a mutual friend she is doing poorly and needs ROV.  Let her know I only have office hours next Tuesday PM, offer her a spot and double book if needed.  Thanks

## 2019-05-22 NOTE — Telephone Encounter (Signed)
Dr Ardis Hughs I made her an appt for 1/8.  She was unable to come in on Tues.  I doubled booked at 2:50 pm

## 2019-05-23 NOTE — Telephone Encounter (Signed)
Dr Ardis Hughs is this ok to refill?  It is not on her list of meds.

## 2019-05-24 DIAGNOSIS — M19012 Primary osteoarthritis, left shoulder: Secondary | ICD-10-CM | POA: Insufficient documentation

## 2019-05-28 ENCOUNTER — Ambulatory Visit: Payer: 59 | Admitting: Podiatry

## 2019-06-04 NOTE — Telephone Encounter (Signed)
Patient called today wanting to follow up on previous RF request

## 2019-06-05 ENCOUNTER — Telehealth: Payer: Self-pay | Admitting: Gastroenterology

## 2019-06-05 MED ORDER — SUCRALFATE 1 G PO TABS: 1.0000 g | ORAL_TABLET | Freq: Three times a day (TID) | ORAL | 3 refills | Status: DC

## 2019-06-05 NOTE — Telephone Encounter (Signed)
I spoke with the pt and she tells me that she will follow recommendations and does not need omeprazole prescription.  She was sent in carafate and she will keep appt as planned.

## 2019-06-05 NOTE — Telephone Encounter (Signed)
Left message on machine to call back  

## 2019-06-05 NOTE — Telephone Encounter (Signed)
I heard from a mutual friend that she is doing poorly  Can you reach out to [facility] this morning:  Please ask her about nsaid use. I see both celebrex and mobic on her med list. Those can be causing some of her discomforts. Also ask about any other OTC type nsaids. She should limit nsaids as much as possible.  AT OV several months ago it sounded like she drank an excessive amount of coffee. Recommend she cut back on that to one cup daily (in AM), about 12-18 oz.  She should not lay down to bed within 3 hours of eating anything or drinking caffeine or etoh.  Offer her new script for omeprazole 40mg  BID. Disp 60 with 11 refills.  Offer her a new script for carafate 1gm pills, one pill TID with food.  She should keep the OV for 1/8 as currently planned.  Thanks

## 2019-06-10 ENCOUNTER — Telehealth: Payer: Self-pay | Admitting: Gastroenterology

## 2019-06-10 ENCOUNTER — Encounter: Payer: Self-pay | Admitting: Obstetrics & Gynecology

## 2019-06-10 ENCOUNTER — Other Ambulatory Visit: Payer: Self-pay

## 2019-06-10 ENCOUNTER — Ambulatory Visit (INDEPENDENT_AMBULATORY_CARE_PROVIDER_SITE_OTHER): Payer: Managed Care, Other (non HMO) | Admitting: Obstetrics & Gynecology

## 2019-06-10 VITALS — BP 122/78 | Ht 63.0 in | Wt 133.0 lb

## 2019-06-10 DIAGNOSIS — Z30431 Encounter for routine checking of intrauterine contraceptive device: Secondary | ICD-10-CM

## 2019-06-10 DIAGNOSIS — Z113 Encounter for screening for infections with a predominantly sexual mode of transmission: Secondary | ICD-10-CM

## 2019-06-10 DIAGNOSIS — Z01419 Encounter for gynecological examination (general) (routine) without abnormal findings: Secondary | ICD-10-CM | POA: Diagnosis not present

## 2019-06-10 DIAGNOSIS — R102 Pelvic and perineal pain: Secondary | ICD-10-CM

## 2019-06-10 DIAGNOSIS — Z9889 Other specified postprocedural states: Secondary | ICD-10-CM

## 2019-06-10 NOTE — Progress Notes (Signed)
[name] [birth year] [id number]   History:    49 y.o. G2P2L2 Married.  Son 23 yo, daughter 51 yo.  RP:  Established patient presenting for annual gyn exam   HPI: Well on Mirena IUD x 06/2018, except for frequent BTB x 1 month and pelvic pain/discomfort bilaterally.  No abnormal vaginal discharge.  No recent IC.  H/O LEEP.  Last Pap 05/2018 Negative. Urine/BMs normal.  Breasts normal.  Will schedule Screening Mammo now.  Health Labs with Fam MD.  BMI 23.56.  Fitness activities limited by Tendinitis.  Past medical history,surgical history, family history and social history were all reviewed and documented in the EPIC chart.  Gynecologic History No LMP recorded (lmp unknown). (Menstrual status: IUD).  Obstetric History OB History  Gravida Para Term Preterm AB Living  2 2       2   SAB TAB Ectopic Multiple Live Births               # Outcome Date GA Lbr Len/2nd Weight Sex Delivery Anes PTL Lv  2 Para           1 Para              ROS: A ROS was performed and pertinent positives and negatives are included in the history.  GENERAL: No fevers or chills. HEENT: No change in vision, no earache, sore throat or sinus congestion. NECK: No pain or stiffness. CARDIOVASCULAR: No chest pain or pressure. No palpitations. PULMONARY: No shortness of breath, cough or wheeze. GASTROINTESTINAL: No abdominal pain, nausea, vomiting or diarrhea, melena or bright red blood per rectum. GENITOURINARY: No urinary frequency, urgency, hesitancy or dysuria. MUSCULOSKELETAL: No joint or muscle pain, no back pain, no recent trauma. DERMATOLOGIC: No rash, no itching, no lesions. ENDOCRINE: No polyuria, polydipsia, no heat or cold intolerance. No recent change in weight. HEMATOLOGICAL: No anemia or easy bruising or bleeding. NEUROLOGIC: No headache, seizures, numbness, tingling or weakness. PSYCHIATRIC: No depression, no loss of interest in normal activity or change in sleep pattern.     Exam:   BP 122/78 (BP  Location: Right Arm, Patient Position: Sitting, Cuff Size: Normal)   Ht 5\' 3"  (1.6 m)   Wt 133 lb (60.3 kg)   LMP  (LMP Unknown) Comment: spotting  BMI 23.56 kg/m   Body mass index is 23.56 kg/m.  General appearance : Well developed well nourished female. No acute distress HEENT: Eyes: no retinal hemorrhage or exudates,  Neck supple, trachea midline, no carotid bruits, no thyroidmegaly Lungs: Clear to auscultation, no rhonchi or wheezes, or rib retractions  Heart: Regular rate and rhythm, no murmurs or gallops Breast:Examined in sitting and supine position were symmetrical in appearance, no palpable masses or tenderness,  no skin retraction, no nipple inversion, no nipple discharge, no skin discoloration, no axillary or supraclavicular lymphadenopathy Abdomen: no palpable masses or tenderness, no rebound or guarding Extremities: no edema or skin discoloration or tenderness  Pelvic: Vulva: Normal             Vagina: No gross lesions or discharge  Cervix: No gross lesions or discharge.  Pap/HPV HR/Gono-Chlam done.  IUD strings visible at the external os.  Uterus  AV, normal size, shape and consistency, non-tender and mobile  Adnexa  Without masses or tenderness  Anus: Normal   Assessment/Plan:  49 y.o. female for annual exam   1. Encounter for gynecological examination with Papanicolaou smear of cervix Normal gynecologic exam.  Pap test with high-risk HPV  done today.  Breast exam normal.  Last screening mammogram November 2019 was negative, patient will schedule a screening mammogram now.  Health labs with family physician.  Good body mass index at 23.56.  Will increase fitness activities when healed from her tendinitis.  Continue with healthy nutrition.  2. H/O LEEP Last Pap test December 2019 was negative.  We will continue Pap test annually.  3. Encounter for routine checking of intrauterine contraceptive device (IUD) Mirena IUD since January 2020.  Well-tolerated and in good  position with the strings visible at the external os.  4. Screen for STD (sexually transmitted disease) - Gono-Chlam on Pap - HIV antibody (with reflex) - RPR - Hepatitis C Antibody - Hepatitis B Surface AntiGEN  5. Pelvic pain in female We will do STD screening.  Patient will call back to organize a pelvic ultrasound if no improvement.  Other orders - FLUoxetine (PROZAC) 10 MG capsule; Take 10 mg by mouth daily.  Princess Bruins MD, 2:23 PM 06/10/2019

## 2019-06-10 NOTE — Patient Instructions (Signed)
1. Encounter for gynecological examination with Papanicolaou smear of cervix Normal gynecologic exam.  Pap test with high-risk HPV done today.  Breast exam normal.  Last screening mammogram November 2019 was negative, patient will schedule a screening mammogram now.  Health labs with family physician.  Good body mass index at 23.56.  Will increase fitness activities when healed from her tendinitis.  Continue with healthy nutrition.  2. H/O LEEP Last Pap test December 2019 was negative.  We will continue Pap test annually.  3. Encounter for routine checking of intrauterine contraceptive device (IUD) Mirena IUD since January 2020.  Well-tolerated and in good position with the strings visible at the external os.  4. Screen for STD (sexually transmitted disease) - Gono-Chlam on Pap - HIV antibody (with reflex) - RPR - Hepatitis C Antibody - Hepatitis B Surface AntiGEN  5. Pelvic pain in female We will do STD screening.  Patient will call back to organize a pelvic ultrasound if no improvement.  Other orders - FLUoxetine (PROZAC) 10 MG capsule; Take 10 mg by mouth daily.  Henleigh, it was a pleasure seeing you today!  I will inform you of your results as soon as they are available.

## 2019-06-10 NOTE — Addendum Note (Signed)
Addended by: Lorine Bears on: 06/10/2019 03:21 PM   Modules accepted: Orders

## 2019-06-10 NOTE — Telephone Encounter (Signed)
Pt advised that Dr Ardis Hughs has no sooner appts.  She is scheduled for 1/8.  She will keep that appt and I will keep a check on the schedule today and if any cancellations I will call her and let her know.  The pt has been advised of the information and verbalized understanding.

## 2019-06-10 NOTE — Telephone Encounter (Signed)
Pt reported that sucralfate is not helping and that she has lost 5 lbs within a week.  She requested a sooner appt.

## 2019-06-11 ENCOUNTER — Ambulatory Visit (INDEPENDENT_AMBULATORY_CARE_PROVIDER_SITE_OTHER): Payer: No Typology Code available for payment source | Admitting: Gastroenterology

## 2019-06-11 ENCOUNTER — Encounter: Payer: Self-pay | Admitting: Gastroenterology

## 2019-06-11 ENCOUNTER — Other Ambulatory Visit (INDEPENDENT_AMBULATORY_CARE_PROVIDER_SITE_OTHER): Payer: No Typology Code available for payment source

## 2019-06-11 VITALS — BP 100/68 | HR 68 | Temp 97.8°F | Ht 63.0 in | Wt 133.8 lb

## 2019-06-11 DIAGNOSIS — R1013 Epigastric pain: Secondary | ICD-10-CM | POA: Diagnosis not present

## 2019-06-11 DIAGNOSIS — R634 Abnormal weight loss: Secondary | ICD-10-CM

## 2019-06-11 LAB — CBC WITH DIFFERENTIAL/PLATELET
Basophils Absolute: 0.1 10*3/uL (ref 0.0–0.1)
Basophils Relative: 1.5 % (ref 0.0–3.0)
Eosinophils Absolute: 0.1 10*3/uL (ref 0.0–0.7)
Eosinophils Relative: 3 % (ref 0.0–5.0)
HCT: 41.1 % (ref 36.0–46.0)
Hemoglobin: 14 g/dL (ref 12.0–15.0)
Lymphocytes Relative: 25.8 % (ref 12.0–46.0)
Lymphs Abs: 1.1 10*3/uL (ref 0.7–4.0)
MCHC: 34.1 g/dL (ref 30.0–36.0)
MCV: 92.2 fl (ref 78.0–100.0)
Monocytes Absolute: 0.6 10*3/uL (ref 0.1–1.0)
Monocytes Relative: 13.7 % — ABNORMAL HIGH (ref 3.0–12.0)
Neutro Abs: 2.4 10*3/uL (ref 1.4–7.7)
Neutrophils Relative %: 56 % (ref 43.0–77.0)
Platelets: 217 10*3/uL (ref 150.0–400.0)
RBC: 4.46 Mil/uL (ref 3.87–5.11)
RDW: 13.3 % (ref 11.5–15.5)
WBC: 4.3 10*3/uL (ref 4.0–10.5)

## 2019-06-11 LAB — HIV ANTIBODY (ROUTINE TESTING W REFLEX): HIV 1&2 Ab, 4th Generation: NONREACTIVE

## 2019-06-11 LAB — COMPREHENSIVE METABOLIC PANEL
ALT: 26 U/L (ref 0–35)
AST: 24 U/L (ref 0–37)
Albumin: 4.6 g/dL (ref 3.5–5.2)
Alkaline Phosphatase: 54 U/L (ref 39–117)
BUN: 15 mg/dL (ref 6–23)
CO2: 28 mEq/L (ref 19–32)
Calcium: 9.4 mg/dL (ref 8.4–10.5)
Chloride: 103 mEq/L (ref 96–112)
Creatinine, Ser: 0.76 mg/dL (ref 0.40–1.20)
GFR: 81.75 mL/min (ref >60.00)
Glucose, Bld: 104 mg/dL — ABNORMAL HIGH (ref 70–99)
Potassium: 3.2 mEq/L — ABNORMAL LOW (ref 3.5–5.1)
Sodium: 139 mEq/L (ref 135–145)
Total Bilirubin: 0.3 mg/dL (ref 0.2–1.2)
Total Protein: 7.2 g/dL (ref 6.0–8.3)

## 2019-06-11 LAB — PAP IG, CT-NG NAA, HPV HIGH-RISK
C. trachomatis RNA, TMA: NOT DETECTED
HPV DNA High Risk: NOT DETECTED
N. gonorrhoeae RNA, TMA: NOT DETECTED

## 2019-06-11 LAB — HEPATITIS B SURFACE ANTIGEN: Hepatitis B Surface Ag: NONREACTIVE

## 2019-06-11 LAB — RPR: RPR Ser Ql: NONREACTIVE

## 2019-06-11 LAB — HEPATITIS C ANTIBODY
Hepatitis C Ab: NONREACTIVE
SIGNAL TO CUT-OFF: 0.02 (ref <1.00)

## 2019-06-11 LAB — AMYLASE: Amylase: 36 U/L (ref 27–131)

## 2019-06-11 LAB — LIPASE: Lipase: 42 U/L (ref 11.0–59.0)

## 2019-06-11 MED ORDER — SUCRALFATE 1 G PO TABS: 1.0000 g | ORAL_TABLET | Freq: Three times a day (TID) | ORAL | 3 refills | Status: DC

## 2019-06-11 MED ORDER — OMEPRAZOLE 40 MG PO CPDR: 40.0000 mg | DELAYED_RELEASE_CAPSULE | Freq: Two times a day (BID) | ORAL | 5 refills | Status: DC

## 2019-06-11 MED ORDER — ONDANSETRON 8 MG PO TBDP: 8.0000 mg | ORAL_TABLET | Freq: Once | ORAL | 0 refills | Status: AC

## 2019-06-11 NOTE — Telephone Encounter (Signed)
Patient scheduled for today 1/5 at 3:00

## 2019-06-11 NOTE — Patient Instructions (Addendum)
If you are age 47 or older, your body mass index should be between 23-30. Your Body mass index is 23.7 kg/m. If this is out of the aforementioned range listed, please consider follow up with your Primary Care Provider.  If you are age 67 or younger, your body mass index should be between 19-25. Your Body mass index is 23.7 kg/m. If this is out of the aformentioned range listed, please consider follow up with your Primary Care Provider.   Your provider has requested that you go to the basement level for lab work before leaving today. Press "B" on the elevator. The lab is located at the first door on the left as you exit the elevator.   1. Omeprazole 40 mg twice daily 20-30 before meals.  2. Carafate 3-4 times daily. 3. Pepcid 20 mg (OTC) at bedtime.   You have been scheduled for a CT scan of the abdomen and pelvis at [facility] ([address]---this is in the same building as [employer]).   You are scheduled on Wednesday 06/12/19 at 9 am. You should arrive 15 minutes prior to your appointment time for registration. Please follow the written instructions below on the day of your exam:  WARNING: IF YOU ARE ALLERGIC TO IODINE/X-RAY DYE, PLEASE NOTIFY RADIOLOGY IMMEDIATELY AT [phone]! YOU WILL BE GIVEN A 13 HOUR PREMEDICATION PREP.  1) Do not eat or drink anything after 5 am (4 hours prior to your test) 2) You have been given 2 bottles of oral contrast to drink. The solution may taste better if refrigerated, but do NOT add ice or any other liquid to this solution. Shake well before drinking.    Drink 1 bottle of contrast @ 7 am (2 hours prior to your exam)  Drink 1 bottle of contrast @ 8 am (1 hour prior to your exam)  You may take any medications as prescribed with a small amount of water, if necessary. If you take any of the following medications: METFORMIN, GLUCOPHAGE, GLUCOVANCE, AVANDAMET, RIOMET, FORTAMET, [place] MET, JANUMET, GLUMETZA or METAGLIP, you MAY  be asked to HOLD this medication 48 hours AFTER the exam.  The purpose of you drinking the oral contrast is to aid in the visualization of your intestinal tract. The contrast solution may cause some diarrhea. Depending on your individual set of symptoms, you may also receive an intravenous injection of x-ray contrast/dye. Plan on being at [facility] for 30 minutes or longer, depending on the type of exam you are having performed.  This test typically takes 30-45 minutes to complete.  If you have any questions regarding your exam or if you need to reschedule, you may call the CT department at [phone] between the hours of 8:00 am and 5:00 pm, Monday-Friday.  _________________________________________________________________

## 2019-06-11 NOTE — Telephone Encounter (Signed)
Opened in Error.

## 2019-06-11 NOTE — Telephone Encounter (Signed)
Can you please offer her the 3 or 3:30 this afternoon?  Thank you

## 2019-06-11 NOTE — Progress Notes (Signed)
Review of pertinent gastrointestinal problems: 1.  Extensive endoscopic testing through Dr. Harrell Lark for a single incidence of Barrett's esophagus proven on biopsy. upper endoscopy with Dr. Harrell Lark [facility] when she was 49 years old May 2009.  This was done for "persistent upper abdominal pain".  He described "mild esophagitis was notable at the distal 4 cm of the esophagus.  Biopsy was taken from 2 and 4 cm above the GE junction to rule out Barrett's esophagus" pathology which I reviewed 2 cm above the GE junction was reported as "chronic inflammation of gastric type mucosa unassociated with intestinal metaplasia" biopsy of 4 cm above the GE junction was reported as "chronic inflammation of gastric type mucosa exhibiting focal intestinal metaplasia consistent with Barrett's esophagus" Pathology report from EGD May 2011 esophagus 2 cm above the GE junction showed "chronic inflammation of gastric type mucosa on associated with intestinal metaplasia" esophagus 4 cm above the GE junction pathologically was exactly the same.  He repeated EGD 10/2011 he took at least 10 different bottles worth of biopsies.  It looks like every 2 cm starting at the GE junction and ending 15 cm from the incisors.  Barrett's esophagus was never described in fact it was specifically stated that she did not have Barrett's esophagus.  He repeated EGD less than 1 year later March 2016 pathology reports stated "inflamed gastric type mucosa with no intestinal metaplasia (no Barrett's esophagus)" this was the same at 1 cm and also at 5 cm above the GE junction.  He recommended repeat EGD March 2019.  Change care to [facility] August 2020.  No further endoscopic testing recommended  2.  GERD without alarm symptoms August 2020; significant caffeine intake possibly contributes 3. "FH of colon cancer": colonoscopy report from Dr. Harrell Lark dated March 2016.  Indication "family history of colon cancer,  abdominal pain, constipation".  Findings no polyps.  Bowel prep "inadequate" view of colonic mucosa "inadequate" description of colonic mucosa unable to view secondary to "inadequate or poor prep" he recommended she have a repeat colonoscopy at 7 years.    Changed care to [facility] August 2020.  I recommended repeat colonoscopy for routine risk starting around her 52th birthday.  Family history was not significant enough to warrant more frequent examinations.   HPI: This is a very pleasant 49 year old woman whom I last saw several months ago for GERD without alarm symptoms.  She is here today with new issues.  Her GERD has probably been gradually worsening over the past month which she is really bothered by is significant epigastric pains that have been constant for about 10 days now.  Pain is rather severe, stabbing, can radiate to both shoulders and her back.  She has lost about 5 pounds.  She feels dyspeptic as well.  She was taking meloxicam as well as intermittently for over-the-counter ibuprofen for her rheumatoid arthritis and joint pains up until a week to 2 weeks ago.  She completely stopped taking those however the pain has persisted.  She has been on twice daily proton pump inhibitor as well as Carafate with meals and Pepcid at bedtime for the past 10 days to 2 weeks as well.   ROS: complete GI ROS as described in HPI, all other review negative.  Constitutional:  No unintentional weight loss   Past Medical History:  Diagnosis Date  . Anxiety   . Asthma   . Barrett's esophagus   . Fibromyalgia   . Functional  ovarian cysts   . GERD (gastroesophageal reflux disease)   . Headache    migraine  . Hypertension   . Pneumothorax   . RA (rheumatoid arthritis) ([place])     Past Surgical History:  Procedure Laterality Date  . CHOLECYSTECTOMY    . ovarian tumor    . TONSILLECTOMY      Current Outpatient Medications  Medication Sig Dispense Refill  . Ascorbic Acid (VITAMIN C) 1000  MG tablet Take 1,000 mg by mouth daily.    . cetirizine (ZYRTEC) 10 MG tablet Take 10 mg by mouth daily.    . Etanercept (ENBREL [place]) Inject into the skin.    [name] Kitchen FLUoxetine (PROZAC) 10 MG capsule Take 10 mg by mouth daily.    . fluticasone (FLONASE) 50 MCG/ACT nasal spray Place 2 sprays into both nostrils daily.    [name] Kitchen levonorgestrel (MIRENA) 20 MCG/24HR IUD 1 each by Intrauterine route once.    [name] Kitchen LORazepam (ATIVAN) 0.5 MG tablet Take 0.5 mg by mouth 2 (two) times daily as needed for anxiety.    . nitroGLYCERIN (NITRO-DUR) 0.2 mg/hr patch Place 1 patch (0.2 mg total) onto the skin daily. 30 patch 12  . omeprazole (PRILOSEC) 40 MG capsule Take 40 mg by mouth daily.    . primidone (MYSOLINE) 250 MG tablet Take 250 mg by mouth daily.    . sucralfate (CARAFATE) 1 g tablet Take 1 tablet (1 g total) by mouth 4 (four) times daily -  with meals and at bedtime. 120 tablet 3   No current facility-administered medications for this visit.    Allergies as of 06/11/2019 - Review Complete 06/11/2019  Allergen Reaction Noted  . Ciprofloxacin  07/25/2016  . Codeine  07/25/2016  . Diclofenac sodium  07/25/2016  . Doxycycline hyclate  07/25/2016  . Erythromycin  07/25/2016  . Iodine  07/25/2016  . Levaquin [levofloxacin in d5w]  07/25/2016  . Nubain [nalbuphine hcl]  07/25/2016  . Zithromax [azithromycin]  07/25/2016  . Penicillins Rash 07/25/2016    Family History  Problem Relation Age of Onset  . Rheumatologic disease Father   . Tremor Father   . Colon polyps Father   . Heart attack Paternal Grandfather   . Colon cancer Maternal Uncle        died 49  . Colon cancer Paternal Grandmother        died 69  . Colon polyps Paternal Aunt     Social History   Socioeconomic History  . Marital status: Married    Spouse name: Not on file  . Number of children: 2  . Years of education: Not on file  . Highest education level: Not on file  Occupational History  . Occupation: Therapist, music  Tobacco Use  . Smoking status: Never Smoker  . Smokeless tobacco: Never Used  Substance and Sexual Activity  . Alcohol use: Yes    Alcohol/week: 3.0 standard drinks    Types: 3 Glasses of wine per week    Comment: 3 GLASSES A WEEK   . Drug use: No  . Sexual activity: Yes    Partners: Male    Birth control/protection: I.U.D.    Comment: 1st intercourse- 70, partners- 33, married- 61 yrs   Other Topics Concern  . Not on file  Social History Narrative  . Not on file   Social Determinants of Health   Financial Resource Strain:   . Difficulty of Paying Living Expenses: Not on file  Food Insecurity:   .  Worried About Charity fundraiser in the Last Year: Not on file  . Ran Out of Food in the Last Year: Not on file  Transportation Needs:   . Lack of Transportation (Medical): Not on file  . Lack of Transportation (Non-Medical): Not on file  Physical Activity:   . Days of Exercise per Week: Not on file  . Minutes of Exercise per Session: Not on file  Stress:   . Feeling of Stress : Not on file  Social Connections:   . Frequency of Communication with Friends and Family: Not on file  . Frequency of Social Gatherings with Friends and Family: Not on file  . Attends Religious Services: Not on file  . Active Member of Clubs or Organizations: Not on file  . Attends Archivist Meetings: Not on file  . Marital Status: Not on file  Intimate Partner Violence:   . Fear of Current or Ex-Partner: Not on file  . Emotionally Abused: Not on file  . Physically Abused: Not on file  . Sexually Abused: Not on file     Physical Exam: BP 100/68   Pulse 68   Temp 97.8 F (36.6 C)   Ht 5\' 3"  (1.6 m)   Wt 133 lb 12.8 oz (60.7 kg)   LMP  (LMP Unknown) Comment: spotting  BMI 23.70 kg/m  Constitutional: generally well-appearing Psychiatric: alert and oriented x3 Abdomen: soft, mild tenderness in the epigastric region, nondistended, no obvious ascites, no peritoneal signs,  normal bowel sounds No peripheral edema noted in lower extremities  Assessment and plan: 49 y.o. female with chronic GERD, new significant epigastric pain  Possibly these pains are related to NSAIDs that she was taking for her rheumatoid arthritis including meloxicam and ibuprofen.  She did stop the NSAIDs in the last 1 to 2 weeks and she has been on twice daily proton pump inhibitor for about the same time.  I would expect that also related pain would start improving by now.  Possibly this is pancreatic related.  My suspicion for underlying neoplasm is low however not nonexistent and I recommended expedited work-up with CT scan abdomen pelvis, CBC complete metabolic profile, amylase and lipase as well as H. pylori serology.  I recommended she start taking 2 extra Tylenol twice a day for her rheumatoid, joint pains for now.  She understands that if the above testing is not definitive the next that would be upper endoscopy.  Please see the "Patient Instructions" section for addition details about the plan.  Owens Loffler, MD [facility] 06/11/2019, 3:01 PM

## 2019-06-12 ENCOUNTER — Other Ambulatory Visit: Payer: Self-pay | Admitting: Gastroenterology

## 2019-06-12 ENCOUNTER — Telehealth: Payer: Self-pay | Admitting: *Deleted

## 2019-06-12 ENCOUNTER — Ambulatory Visit (INDEPENDENT_AMBULATORY_CARE_PROVIDER_SITE_OTHER): Payer: No Typology Code available for payment source

## 2019-06-12 ENCOUNTER — Ambulatory Visit (INDEPENDENT_AMBULATORY_CARE_PROVIDER_SITE_OTHER)
Admission: RE | Admit: 2019-06-12 | Discharge: 2019-06-12 | Disposition: A | Discharge status: Home / Self Care | Payer: No Typology Code available for payment source | Source: Ambulatory Visit | Attending: Gastroenterology | Admitting: Gastroenterology

## 2019-06-12 ENCOUNTER — Encounter: Payer: Self-pay | Admitting: Gastroenterology

## 2019-06-12 ENCOUNTER — Other Ambulatory Visit: Payer: Self-pay

## 2019-06-12 DIAGNOSIS — R634 Abnormal weight loss: Secondary | ICD-10-CM

## 2019-06-12 DIAGNOSIS — R1013 Epigastric pain: Secondary | ICD-10-CM

## 2019-06-12 DIAGNOSIS — Z1159 Encounter for screening for other viral diseases: Secondary | ICD-10-CM

## 2019-06-12 DIAGNOSIS — N83202 Unspecified ovarian cyst, left side: Secondary | ICD-10-CM

## 2019-06-12 DIAGNOSIS — K2270 Barrett's esophagus without dysplasia: Secondary | ICD-10-CM

## 2019-06-12 LAB — H. PYLORI ANTIBODY, IGG: H Pylori IgG: NEGATIVE

## 2019-06-12 MED ORDER — IOHEXOL 300 MG/ML  SOLN
100.0000 mL | Freq: Once | INTRAMUSCULAR | Status: AC | PRN
  Administered 2019-06-12: 09:00:00 100 mL via INTRAVENOUS

## 2019-06-12 NOTE — Telephone Encounter (Signed)
Patient had CT abdomen/pelvis scan done today ordered by Dr.Jacobs. (Report in epic)Report mentioned ovarian cyst. Patient asked if any follow up should be done with you regarding this. Please advise

## 2019-06-13 LAB — SARS CORONAVIRUS 2 (TAT 6-24 HRS): SARS Coronavirus 2: NEGATIVE

## 2019-06-13 NOTE — Telephone Encounter (Signed)
Yes please schedule a Pelvic US with me.    CT Pelvis:  Reproductive: IUD within retroverted uterus. Unremarkable RIGHT ovary. Septated cyst versus adjacent cysts within LEFT ovary in aggregate measuring 5.2 x 3.4 x 3.3 cm.

## 2019-06-14 ENCOUNTER — Encounter: Payer: Self-pay | Admitting: Gastroenterology

## 2019-06-14 ENCOUNTER — Ambulatory Visit: Payer: 59 | Admitting: Gastroenterology

## 2019-06-14 ENCOUNTER — Ambulatory Visit (AMBULATORY_SURGERY_CENTER): Payer: No Typology Code available for payment source | Admitting: Gastroenterology

## 2019-06-14 ENCOUNTER — Other Ambulatory Visit: Payer: Self-pay

## 2019-06-14 VITALS — BP 113/70 | HR 92 | Temp 98.4°F | Resp 16 | Ht 63.0 in | Wt 133.0 lb

## 2019-06-14 DIAGNOSIS — K2950 Unspecified chronic gastritis without bleeding: Secondary | ICD-10-CM | POA: Diagnosis not present

## 2019-06-14 DIAGNOSIS — K2970 Gastritis, unspecified, without bleeding: Secondary | ICD-10-CM | POA: Diagnosis not present

## 2019-06-14 DIAGNOSIS — K2990 Gastroduodenitis, unspecified, without bleeding: Secondary | ICD-10-CM

## 2019-06-14 DIAGNOSIS — R1013 Epigastric pain: Secondary | ICD-10-CM | POA: Diagnosis not present

## 2019-06-14 MED ORDER — SODIUM CHLORIDE 0.9 % IV SOLN: 500.0000 mL | Freq: Once | INTRAVENOUS | Status: DC

## 2019-06-14 NOTE — Progress Notes (Signed)
Called to room to assist during endoscopic procedure.  Patient ID and intended procedure confirmed with present staff. Received instructions for my participation in the procedure from the performing physician.  

## 2019-06-14 NOTE — Progress Notes (Signed)
Temp by JR, vitals by KA  Pt's states no medical or surgical changes since previsit or office visit.

## 2019-06-14 NOTE — Patient Instructions (Signed)
   Gastritis Await pathology  YOU HAD AN ENDOSCOPIC PROCEDURE TODAY AT [facility]:   Refer to the procedure report that was given to you for any specific questions about what was found during the examination.  If the procedure report does not answer your questions, please call your gastroenterologist to clarify.  If you requested that your care partner not be given the details of your procedure findings, then the procedure report has been included in a sealed envelope for you to review at your convenience later.  YOU SHOULD EXPECT: Some feelings of bloating in the abdomen. Passage of more gas than usual.  Walking can help get rid of the air that was put into your GI tract during the procedure and reduce the bloating. If you had a lower endoscopy (such as a colonoscopy or flexible sigmoidoscopy) you may notice spotting of blood in your stool or on the toilet paper. If you underwent a bowel prep for your procedure, you may not have a normal bowel movement for a few days.  Please Note:  You might notice some irritation and congestion in your nose or some drainage.  This is from the oxygen used during your procedure.  There is no need for concern and it should clear up in a day or so.  SYMPTOMS TO REPORT IMMEDIATELY:    Following upper endoscopy (EGD)  Vomiting of blood or coffee ground material  New chest pain or pain under the shoulder blades  Painful or persistently difficult swallowing  New shortness of breath  Fever of 100F or higher  Black, tarry-looking stools  For urgent or emergent issues, a gastroenterologist can be reached at any hour by calling [phone].   DIET:  We do recommend a small meal at first, but then you may proceed to your regular diet.  Drink plenty of fluids but you should avoid alcoholic beverages for 24 hours.  ACTIVITY:  You should plan to take it easy for the rest of today and you should NOT DRIVE or use heavy machinery until tomorrow  (because of the sedation medicines used during the test).    FOLLOW UP: Our staff will call the number listed on your records 48-72 hours following your procedure to check on you and address any questions or concerns that you may have regarding the information given to you following your procedure. If we do not reach you, we will leave a message.  We will attempt to reach you two times.  During this call, we will ask if you have developed any symptoms of COVID 19. If you develop any symptoms (ie: fever, flu-like symptoms, shortness of breath, cough etc.) before then, please call [phone].  If you test positive for Covid 19 in the 2 weeks post procedure, please call and report this information to [place].    If any biopsies were taken you will be contacted by phone or by letter within the next 1-3 weeks.  Please call us at [phone] if you have not heard about the biopsies in 3 weeks.    SIGNATURES/CONFIDENTIALITY: You and/or your care partner have signed paperwork which will be entered into your electronic medical record.  These signatures attest to the fact that that the information above on your After Visit Summary has been reviewed and is understood.  Full responsibility of the confidentiality of this discharge information lies with you and/or your care-partner.

## 2019-06-14 NOTE — Op Note (Signed)
[place] Patient Name: [name] Procedure Date: 06/14/2019 10:13 AM MRN: [id number] Endoscopist: Milus Banister , MD Age: 49 Referring MD:  Date of Birth: 03/03/1971 Gender: Female Account #: [id number] Procedure:                Upper GI endoscopy Indications:              Epigastric abdominal pain, slightly thickened                            stomach on recent CT Medicines:                Monitored Anesthesia Care Procedure:                Pre-Anesthesia Assessment:                           - Prior to the procedure, a History and Physical                            was performed, and patient medications and                            allergies were reviewed. The patient's tolerance of                            previous anesthesia was also reviewed. The risks                            and benefits of the procedure and the sedation                            options and risks were discussed with the patient.                            All questions were answered, and informed consent                            was obtained. Prior Anticoagulants: The patient has                            taken no previous anticoagulant or antiplatelet                            agents. ASA Grade Assessment: II - A patient with                            mild systemic disease. After reviewing the risks                            and benefits, the patient was deemed in                            satisfactory condition to undergo the procedure.  After obtaining informed consent, the endoscope was                            passed under direct vision. Throughout the                            procedure, the patient's blood pressure, pulse, and                            oxygen saturations were monitored continuously. The                            Endoscope was introduced through the mouth, and                            advanced to the second part of duodenum.  The upper                            GI endoscopy was accomplished without difficulty.                            The patient tolerated the procedure well. Scope In: Scope Out: Findings:                 Diffuse moderate inflammation characterized by                            erythema, friability and granularity was found in                            the entire examined stomach. Biopsies were taken                            with a cold forceps for histology.                           The exam was otherwise without abnormality. Complications:            No immediate complications. Estimated blood loss:                            None. Estimated Blood Loss:     Estimated blood loss: none. Impression:               - Moderate gastritis. Likely related to recent                            NSAID use. Biopsied to check for H. pylori.                           - The examination was otherwise normal. Recommendation:           - Patient has a contact number available for                            emergencies. The signs and symptoms  of potential                            delayed complications were discussed with the                            patient. Return to normal activities tomorrow.                            Written discharge instructions were provided to the                            patient.                           - Resume previous diet.                           - Continue present medications.                           - Await pathology results. Milus Banister, MD 06/14/2019 10:33:45 AM This report has been signed electronically.

## 2019-06-14 NOTE — Telephone Encounter (Signed)
Ultrasound order placed, patient informed. Transferred to appointment desk to schedule.

## 2019-06-14 NOTE — Progress Notes (Signed)
Report to PACU, RN, vss, BBS= Clear.  

## 2019-06-18 ENCOUNTER — Other Ambulatory Visit: Payer: Self-pay | Admitting: Gastroenterology

## 2019-06-18 ENCOUNTER — Telehealth: Payer: Self-pay

## 2019-06-18 ENCOUNTER — Other Ambulatory Visit: Payer: Self-pay

## 2019-06-18 MED ORDER — DEXILANT 60 MG PO CPDR: 60.0000 mg | DELAYED_RELEASE_CAPSULE | Freq: Every day | ORAL | 3 refills | Status: DC

## 2019-06-18 NOTE — Telephone Encounter (Signed)
Dr Ardis Hughs I sent the dexilant to the pt pharmacy and it is not covered by her insurance. I completed a prior authorization however, the pt wants to start something tonight at the maxium dose.  She sent a list of what her insurance covers.  Can we send one of the options? If so, which one?

## 2019-06-18 NOTE — Telephone Encounter (Signed)
See results note from biopsies.

## 2019-06-18 NOTE — Telephone Encounter (Signed)
  Follow up Call-  Call back number 06/14/2019  Post procedure Call Back phone  # [phone]  Permission to leave phone message Yes  Some recent data might be hidden     Patient questions:  Do you have a fever, pain , or abdominal swelling? Yes.   Pain Score  4 *  Have you tolerated food without any problems? No.  Have you been able to return to your normal activities? Yes.    Do you have any questions about your discharge instructions: Diet   No. Medications  Yes.   Follow up visit  No.  Do you have questions or concerns about your Care? Yes.    Pt states still suffering from sx prior to procedure, states she is drinking mylanta in addition to other meds as the reflux is terrible after eating food and especially drinking.  Actions: * If pain score is 4 or above: Physician/ provider Notified : Owens Loffler, MD.  1. Have you developed a fever since your procedure? no  2.   Have you had an respiratory symptoms (SOB or cough) since your procedure? no  3.   Have you tested positive for COVID 19 since your procedure no  4.   Have you had any family members/close contacts diagnosed with the COVID 19 since your procedure?  no   If yes to any of these questions please route to Joylene John, RN and Alphonsa Gin, Therapist, sports.

## 2019-06-19 ENCOUNTER — Other Ambulatory Visit: Payer: Self-pay | Admitting: Podiatry

## 2019-06-19 MED ORDER — PANTOPRAZOLE SODIUM 40 MG PO TBEC: 40.0000 mg | DELAYED_RELEASE_TABLET | Freq: Two times a day (BID) | ORAL | 5 refills | Status: DC

## 2019-06-19 NOTE — Addendum Note (Signed)
Addended by: Timothy Lasso on: 06/19/2019 08:35 AM   Modules accepted: Orders

## 2019-06-19 NOTE — Telephone Encounter (Signed)
Left message to call with whether she would like to continue the meloxicam or the celebrex.

## 2019-06-25 ENCOUNTER — Ambulatory Visit (INDEPENDENT_AMBULATORY_CARE_PROVIDER_SITE_OTHER): Payer: No Typology Code available for payment source | Admitting: Podiatry

## 2019-06-25 ENCOUNTER — Other Ambulatory Visit: Payer: Self-pay

## 2019-06-25 ENCOUNTER — Encounter: Payer: Self-pay | Admitting: Podiatry

## 2019-06-25 DIAGNOSIS — M7662 Achilles tendinitis, left leg: Secondary | ICD-10-CM

## 2019-06-25 DIAGNOSIS — M7661 Achilles tendinitis, right leg: Secondary | ICD-10-CM | POA: Diagnosis not present

## 2019-06-26 NOTE — Progress Notes (Signed)
She presents today with a chief concern of Achilles tendinitis of her right foot states the left foot is doing much better now but she feels something on her right if she states that the right heel appears to be developing a bump.  Objective: Vital signs are stable she is alert and oriented x3 she has no pain on dorsiflexion she has no pain on plantar flexion of her right foot though she does have pain posteriorly on palpation of a small soft tissue nodule overlying the distal aspect of the Achilles near the insertion site.  Assessment: Bursitis.  Plan: I injected the bursa today with dexamethasone and local anesthetic follow-up with her on an as-needed basis.

## 2019-06-27 ENCOUNTER — Telehealth: Payer: Self-pay | Admitting: *Deleted

## 2019-06-27 NOTE — Telephone Encounter (Signed)
Patient called left message asking if she should keep her scheduled appointment for ultrasound 07/09/19? I called and received her voicemail , I left detailed message on cell per encounter on 06/12/19. Patient reports no more pelvic, but CT scan on this encounter 06/12/19 Dr.Lavoie ordered pelvic ultrasound due to this.

## 2019-06-28 ENCOUNTER — Telehealth: Payer: Self-pay | Admitting: *Deleted

## 2019-06-28 DIAGNOSIS — R1013 Epigastric pain: Secondary | ICD-10-CM

## 2019-06-28 NOTE — Telephone Encounter (Signed)
Records were faxed via Epic to Dr. Trudie Reed at [facility].

## 2019-06-28 NOTE — Telephone Encounter (Signed)
Dr. Milinda Pointer request chart notes, MRI report be faxed to Dr. Gavin Pound [phone].

## 2019-07-03 ENCOUNTER — Other Ambulatory Visit: Payer: Managed Care, Other (non HMO)

## 2019-07-03 ENCOUNTER — Ambulatory Visit: Payer: Managed Care, Other (non HMO) | Admitting: Obstetrics & Gynecology

## 2019-07-03 ENCOUNTER — Other Ambulatory Visit (INDEPENDENT_AMBULATORY_CARE_PROVIDER_SITE_OTHER): Payer: No Typology Code available for payment source

## 2019-07-03 DIAGNOSIS — R1013 Epigastric pain: Secondary | ICD-10-CM

## 2019-07-03 LAB — IGA: IgA: 160 mg/dL (ref 68–378)

## 2019-07-04 ENCOUNTER — Other Ambulatory Visit: Payer: Self-pay | Admitting: Podiatry

## 2019-07-04 LAB — TISSUE TRANSGLUTAMINASE, IGA: (tTG) Ab, IgA: 1 U/mL

## 2019-07-09 ENCOUNTER — Ambulatory Visit (INDEPENDENT_AMBULATORY_CARE_PROVIDER_SITE_OTHER): Payer: No Typology Code available for payment source | Admitting: Obstetrics & Gynecology

## 2019-07-09 ENCOUNTER — Ambulatory Visit (INDEPENDENT_AMBULATORY_CARE_PROVIDER_SITE_OTHER): Payer: No Typology Code available for payment source

## 2019-07-09 ENCOUNTER — Encounter: Payer: Self-pay | Admitting: Obstetrics & Gynecology

## 2019-07-09 ENCOUNTER — Other Ambulatory Visit: Payer: Self-pay

## 2019-07-09 VITALS — BP 124/82

## 2019-07-09 DIAGNOSIS — N83202 Unspecified ovarian cyst, left side: Secondary | ICD-10-CM

## 2019-07-09 DIAGNOSIS — N854 Malposition of uterus: Secondary | ICD-10-CM

## 2019-07-09 DIAGNOSIS — R102 Pelvic and perineal pain: Secondary | ICD-10-CM

## 2019-07-09 DIAGNOSIS — Z30431 Encounter for routine checking of intrauterine contraceptive device: Secondary | ICD-10-CM

## 2019-07-09 NOTE — Patient Instructions (Signed)
1. Cyst of left ovary Pelvic ultrasound findings thoroughly reviewed with patient.  The uterus is normal with a thin endometrial lining and the IUD in good position.  The right ovary is normal.  The left ovary presents 3 simple appearing avascular thin-walled small cystic structures measured at 3.2 cm, 2.4 cm and 1.7 cm.  Patient is reassured that does appear to be functional ovarian cyst.  2. Pelvic pain in female Resolved pelvic pain.  3. Encounter for routine checking of intrauterine contraceptive device (IUD) Mirena IUD in good intrauterine position and well-tolerated.  [name], it was a pleasure seeing you today!

## 2019-07-09 NOTE — Progress Notes (Signed)
    [name] November 29, 1970 [id number]        49 y.o.  G2P2L2 Married  RP: Left ovarian cyst on CT scan for Pelvic US  HPI:  Resolved left pelvic pain.  Well with Mirena IUD.     OB History  Gravida Para Term Preterm AB Living  2 2       2   SAB TAB Ectopic Multiple Live Births               # Outcome Date GA Lbr Len/2nd Weight Sex Delivery Anes PTL Lv  2 Para           1 Para             Past medical history,surgical history, problem list, medications, allergies, family history and social history were all reviewed and documented in the EPIC chart.   Directed ROS with pertinent positives and negatives documented in the history of present illness/assessment and plan.  Exam:  Vitals:   07/09/19 1045  BP: 124/82   General appearance:  Normal  CT scan Abdo-pelvis 06/12/2019:  Pelvic US today: T/V images. Retroverted uterus normal in size and shape with no myometrial mass. The uterus is measured at 8.38 x 4.85 x 3.78 cm. Thin symmetrical endometrial lining with no mass or thickening seen. The IUD is noted in proper position within the cavity. The endometrial lining is measured at 2.81 mm. Right ovary with sparse follicles. Left ovary with 3 simple appearing avascular thin-walled cystic structures measured at 3.2 x 2.3 cm, 2.4 x 2.2 cm, 1.7 x 0.7 cm. The left ovary is slightly tender to palpation. Small amount of free fluid in posterior to the sac outlining several thin wispy adhesions.   Assessment/Plan:  49 y.o. G2P2   1. Cyst of left ovary Pelvic ultrasound findings thoroughly reviewed with patient.  The uterus is normal with a thin endometrial lining and the IUD in good position.  The right ovary is normal.  The left ovary presents 3 simple appearing avascular thin-walled small cystic structures measured at 3.2 cm, 2.4 cm and 1.7 cm.  Patient is reassured that does appear to be functional ovarian cyst.  2. Pelvic pain in female Resolved pelvic pain.  3. Encounter for  routine checking of intrauterine contraceptive device (IUD) Mirena IUD in good intrauterine position and well-tolerated.  Princess Bruins MD, 11:06 AM 07/09/2019

## 2019-07-15 ENCOUNTER — Other Ambulatory Visit: Payer: Self-pay

## 2019-07-15 ENCOUNTER — Encounter (HOSPITAL_COMMUNITY)
Admission: RE | Admit: 2019-07-15 | Discharge: 2019-07-15 | Disposition: A | Discharge status: Home / Self Care | Payer: No Typology Code available for payment source | Source: Ambulatory Visit | Attending: Gastroenterology | Admitting: Gastroenterology

## 2019-07-15 DIAGNOSIS — R1013 Epigastric pain: Secondary | ICD-10-CM

## 2019-07-15 MED ORDER — TECHNETIUM TC 99M SULFUR COLLOID
2.1300 | Freq: Once | INTRAVENOUS | Status: AC
  Administered 2019-07-15: 2.13 via ORAL

## 2019-07-17 ENCOUNTER — Telehealth: Payer: Self-pay

## 2019-07-17 NOTE — Telephone Encounter (Signed)
March 11th it is. Could i get on the cancellation list in case someone cancels for an earlier time please? Also, did he want any changes to my current regimen that I listed?   You  Arretta, Oriol 11 minutes ago (12:18 PM)   His first available is 3/11.  He only has one day a week in the hospital to do procedures.  He is fully booked until the 92 th of March.  He only has morning appointments available on Thursdays due to hospital availability.     Felicitas, Holder  You 34 minutes ago (11:55 AM)   Seleta Rhymes, I really hate to wait 5 more weeks for the test. I could do late day on Thursday. My presentation is 12-1. Does he have any availability later in the day on Thursday? If not, any chance to get in any sooner? Could you please ask him? If I could cancel this presentation I would, but no one knows the client except me.  Also, just wanted to provide my current regimen - 40mg  Pepcid morning and before dinner, Tagament in am and pm with Pepcid, sulfacarate2-4 times a day. I slowly weaned off the protonix last week.   You  Zamyra, Risk W 40 minutes ago (11:48 AM)   The the test has to be done at [facility] on a Thursday.  You will be put to sleep and will need a driver.  His next available is 08/15/19.  Let me know if this will work for you.    Thy, Bender  You 1 hour ago (11:26 AM)   Faythe Ghee, can I come on Friday for the testing? I had increased my prozac a couple of weeks ago already so I will continue. I can come anytime on friday except 11 am.  Ty,  v   You  Jaydee, Dachel Yesterday (9:59 AM)   Your stomach is functioning normally. Have to wonder if this is hypersensitiviy to GERD.  You will need Bravo pH testing (while still on your  antiacid medicines). We can offer first available time on a Thursday.  If you would like to schedule this appointment let me know and I can set that up for you.   Increase her prozac (this can help hypersensitive esophagus) to 20 mg pills, one pill at  bedtime nightly. Let ms know if you need a preacription refill.  Thank you

## 2019-07-18 ENCOUNTER — Other Ambulatory Visit: Payer: Self-pay

## 2019-07-18 DIAGNOSIS — R1013 Epigastric pain: Secondary | ICD-10-CM

## 2019-07-18 DIAGNOSIS — K2970 Gastritis, unspecified, without bleeding: Secondary | ICD-10-CM

## 2019-07-18 DIAGNOSIS — K219 Gastro-esophageal reflux disease without esophagitis: Secondary | ICD-10-CM

## 2019-07-18 NOTE — Telephone Encounter (Signed)
The pt has been advised via My Chart per request.

## 2019-07-29 ENCOUNTER — Other Ambulatory Visit: Payer: Self-pay

## 2019-07-29 MED ORDER — SUCRALFATE 1 G PO TABS: 1.0000 g | ORAL_TABLET | Freq: Three times a day (TID) | ORAL | 1 refills | Status: DC

## 2019-07-29 NOTE — Telephone Encounter (Signed)
Pharmacy requested 90 day supply of patient's sucralfate to be sent in. This has been done. Patient has upcoming procedure in March.

## 2019-08-12 ENCOUNTER — Other Ambulatory Visit (HOSPITAL_COMMUNITY)
Admission: RE | Admit: 2019-08-12 | Discharge: 2019-08-12 | Disposition: A | Discharge status: Home / Self Care | Payer: 59 | Source: Ambulatory Visit | Attending: Gastroenterology | Admitting: Gastroenterology

## 2019-08-12 DIAGNOSIS — Z01812 Encounter for preprocedural laboratory examination: Secondary | ICD-10-CM | POA: Diagnosis not present

## 2019-08-12 DIAGNOSIS — Z20822 Contact with and (suspected) exposure to covid-19: Secondary | ICD-10-CM | POA: Insufficient documentation

## 2019-08-13 LAB — SARS CORONAVIRUS 2 (TAT 6-24 HRS): SARS Coronavirus 2: NEGATIVE

## 2019-08-15 ENCOUNTER — Other Ambulatory Visit: Payer: Self-pay

## 2019-08-15 ENCOUNTER — Encounter (HOSPITAL_COMMUNITY): Payer: Self-pay | Admitting: Gastroenterology

## 2019-08-15 ENCOUNTER — Ambulatory Visit (HOSPITAL_COMMUNITY): Payer: No Typology Code available for payment source | Admitting: Certified Registered Nurse Anesthetist

## 2019-08-15 ENCOUNTER — Encounter (HOSPITAL_COMMUNITY)
Admission: RE | Disposition: A | Discharge status: Home / Self Care | Payer: Self-pay | Source: Ambulatory Visit | Attending: Gastroenterology

## 2019-08-15 ENCOUNTER — Ambulatory Visit (HOSPITAL_COMMUNITY)
Admission: RE | Admit: 2019-08-15 | Discharge: 2019-08-15 | Disposition: A | Discharge status: Home / Self Care | Payer: No Typology Code available for payment source | Source: Ambulatory Visit | Attending: Gastroenterology | Admitting: Gastroenterology

## 2019-08-15 DIAGNOSIS — K219 Gastro-esophageal reflux disease without esophagitis: Secondary | ICD-10-CM

## 2019-08-15 DIAGNOSIS — K2970 Gastritis, unspecified, without bleeding: Secondary | ICD-10-CM

## 2019-08-15 DIAGNOSIS — M069 Rheumatoid arthritis, unspecified: Secondary | ICD-10-CM | POA: Insufficient documentation

## 2019-08-15 DIAGNOSIS — I10 Essential (primary) hypertension: Secondary | ICD-10-CM | POA: Insufficient documentation

## 2019-08-15 DIAGNOSIS — R109 Unspecified abdominal pain: Secondary | ICD-10-CM | POA: Insufficient documentation

## 2019-08-15 DIAGNOSIS — R1013 Epigastric pain: Secondary | ICD-10-CM

## 2019-08-15 HISTORY — PX: ESOPHAGOGASTRODUODENOSCOPY (EGD) WITH PROPOFOL: SHX5813

## 2019-08-15 HISTORY — PX: BRAVO PH STUDY: SHX5421

## 2019-08-15 SURGERY — ESOPHAGOGASTRODUODENOSCOPY (EGD) WITH PROPOFOL
Anesthesia: Monitor Anesthesia Care

## 2019-08-15 MED ORDER — LIDOCAINE 2% (20 MG/ML) 5 ML SYRINGE
INTRAMUSCULAR | Status: DC | PRN
  Administered 2019-08-15: 40 mg via INTRAVENOUS

## 2019-08-15 MED ORDER — SODIUM CHLORIDE 0.9 % IV SOLN: INTRAVENOUS | Status: DC

## 2019-08-15 MED ORDER — PROPOFOL 10 MG/ML IV BOLUS
INTRAVENOUS | Status: DC | PRN
  Administered 2019-08-15 (×2): 40 mg via INTRAVENOUS
  Administered 2019-08-15: 20 mg via INTRAVENOUS
  Administered 2019-08-15 (×2): 40 mg via INTRAVENOUS
  Administered 2019-08-15: 20 mg via INTRAVENOUS
  Administered 2019-08-15: 40 mg via INTRAVENOUS
  Administered 2019-08-15: 20 mg via INTRAVENOUS
  Administered 2019-08-15: 40 mg via INTRAVENOUS
  Administered 2019-08-15: 20 mg via INTRAVENOUS
  Administered 2019-08-15 (×2): 40 mg via INTRAVENOUS

## 2019-08-15 MED ORDER — LACTATED RINGERS IV SOLN
INTRAVENOUS | Status: AC | PRN
  Administered 2019-08-15: 1000 mL via INTRAVENOUS

## 2019-08-15 SURGICAL SUPPLY — 15 items

## 2019-08-15 NOTE — Anesthesia Preprocedure Evaluation (Addendum)
Anesthesia Evaluation  Patient identified by MRN, date of birth, ID band Patient awake    Reviewed: Allergy & Precautions, NPO status , Patient's Chart, lab work & pertinent test results  Airway Mallampati: I  TM Distance: >3 FB Neck ROM: Full    Dental no notable dental hx. (+) Teeth Intact, Dental Advisory Given   Pulmonary asthma ,    Pulmonary exam normal breath sounds clear to auscultation       Cardiovascular hypertension, negative cardio ROS Normal cardiovascular exam Rhythm:Regular Rate:Normal     Neuro/Psych  Headaches, PSYCHIATRIC DISORDERS Anxiety    GI/Hepatic Neg liver ROS, GERD  Medicated,  Endo/Other  negative endocrine ROS  Renal/GU negative Renal ROS  negative genitourinary   Musculoskeletal  (+) Arthritis , Rheumatoid disorders,  Fibromyalgia -  Abdominal   Peds  Hematology negative hematology ROS (+)   Anesthesia Other Findings EGD for epigastric pain, gastritis, reflux  Reproductive/Obstetrics                           Anesthesia Physical Anesthesia Plan  ASA: II  Anesthesia Plan: MAC   Post-op Pain Management:    Induction: Intravenous  PONV Risk Score and Plan: 2 and Propofol infusion and Treatment may vary due to age or medical condition  Airway Management Planned: Natural Airway  Additional Equipment:   Intra-op Plan:   Post-operative Plan: Extubation in OR  Informed Consent: I have reviewed the patients History and Physical, chart, labs and discussed the procedure including the risks, benefits and alternatives for the proposed anesthesia with the patient or authorized representative who has indicated his/her understanding and acceptance.     Dental advisory given  Plan Discussed with: CRNA  Anesthesia Plan Comments:       Anesthesia Quick Evaluation

## 2019-08-15 NOTE — Anesthesia Procedure Notes (Signed)
Procedure Name: MAC Date/Time: 08/15/2019 2:50 PM Performed by: Cynda Familia, CRNA Pre-anesthesia Checklist: Patient identified, Emergency Drugs available, Suction available, Patient being monitored and Timeout performed Oxygen Delivery Method: Simple face mask Placement Confirmation: positive ETCO2 and breath sounds checked- equal and bilateral Dental Injury: Teeth and Oropharynx as per pre-operative assessment

## 2019-08-15 NOTE — Op Note (Signed)
[facility] Patient Name: [name] Procedure Date: 08/15/2019 MRN: [id number] Attending MD: Milus Banister , MD Date of Birth: 05-08-1971 CSN: [id number] Age: 49 Admit Type: Outpatient Procedure:                Upper GI endoscopy Indications:              Heartburn, intermittent abdominal pain. For Bravo                            pH probe placement Providers:                Milus Banister, MD, Cleda Daub, RN, Lazaro Arms, Technician Referring MD:              Medicines:                Monitored Anesthesia Care Complications:            No immediate complications. Estimated blood loss:                            None. Estimated Blood Loss:     Estimated blood loss: none. Procedure:                Pre-Anesthesia Assessment:                           - Prior to the procedure, a History and Physical                            was performed, and patient medications and                            allergies were reviewed. The patient's tolerance of                            previous anesthesia was also reviewed. The risks                            and benefits of the procedure and the sedation                            options and risks were discussed with the patient.                            All questions were answered, and informed consent                            was obtained. Prior Anticoagulants: The patient has                            taken no previous anticoagulant or antiplatelet                            agents. ASA Grade Assessment:  II - A patient with                            mild systemic disease. After reviewing the risks                            and benefits, the patient was deemed in                            satisfactory condition to undergo the procedure.                           After obtaining informed consent, the endoscope was                            passed under direct vision. Throughout the                           procedure, the patient's blood pressure, pulse, and                            oxygen saturations were monitored continuously. The                            [id number]-[id number]) Olympus gastroscope was                            introduced through the mouth, and advanced to the                            second part of duodenum. The upper GI endoscopy was                            accomplished without difficulty. The patient                            tolerated the procedure well. Scope In: Scope Out: Findings:      The esophagus was normal.      The stomach was normal.      The examined duodenum was normal.      The BRAVO capsule with delivery system was introduced through the mouth       and advanced into the esophagus, such that the BRAVO pH capsule was       positioned 34 cm from the incisors, which was 6 cm proximal to the GE       junction (located at 40cm). The BRAVO pH capsule was then deployed and       attached to the esophageal mucosa. The delivery system was then       withdrawn. Endoscopy was utilized for placement of the probe only. The       scope was reinserted to evaluate placement of the BRAVO capsule.       Visualization showed the BRAVO capsule to be in an appropriate position. Impression:               - Normal UGI tract. The previously noted 'moderate'  gastritis has healed.                           - The BRAVO pH capsule was deployed. Moderate Sedation:      Not Applicable - Patient had care per Anesthesia. Recommendation:           - Patient has a contact number available for                            emergencies. The signs and symptoms of potential                            delayed complications were discussed with the                            patient. Return to normal activities tomorrow.                            Written discharge instructions were provided to the                            patient.                            - Resume previous diet.                           - Continue present medications. Procedure Code(s):         Diagnosis Code(s):        --- Professional ---                           R12, Heartburn CPT copyright 2019 American Medical Association. All rights reserved. The codes documented in this report are preliminary and upon coder review may  be revised to meet current compliance requirements. Milus Banister, MD 08/15/2019 3:11:37 PM This report has been signed electronically. Number of Addenda: 0

## 2019-08-15 NOTE — Transfer of Care (Signed)
Immediate Anesthesia Transfer of Care Note  Patient: [name]  Procedure(s) Performed: ESOPHAGOGASTRODUODENOSCOPY (EGD) WITH PROPOFOL (N/A ) BRAVO PH STUDY (N/A )  Patient Location: PACU and Endoscopy Unit  Anesthesia Type:MAC  Level of Consciousness: awake and alert   Airway & Oxygen Therapy: Patient Spontanous Breathing and Patient connected to face mask oxygen  Post-op Assessment: Report given to RN and Post -op Vital signs reviewed and stable  Post vital signs: Reviewed and stable  Last Vitals:  Vitals Value Taken Time  BP 132/84 08/15/19 1522  Temp    Pulse 75 08/15/19 1522  Resp 17 08/15/19 1522  SpO2 96 % 08/15/19 1522  Vitals shown include unvalidated device data.  Last Pain:  Vitals:   08/15/19 1332  TempSrc: Oral  PainSc: 0-No pain         Complications: No apparent anesthesia complications

## 2019-08-15 NOTE — Discharge Instructions (Signed)
YOU HAD AN ENDOSCOPIC PROCEDURE TODAY: Refer to the procedure report and other information in the discharge instructions given to you for any specific questions about what was found during the examination. If this information does not answer your questions, please call [place] office at [phone] to clarify.   YOU SHOULD EXPECT: Some feelings of bloating in the abdomen. Passage of more gas than usual. Walking can help get rid of the air that was put into your GI tract during the procedure and reduce the bloating. If you had a lower endoscopy (such as a colonoscopy or flexible sigmoidoscopy) you may notice spotting of blood in your stool or on the toilet paper. Some abdominal soreness may be present for a day or two, also.  DIET: Your first meal following the procedure should be a light meal and then it is ok to progress to your normal diet. A half-sandwich or bowl of soup is an example of a good first meal. Heavy or fried foods are harder to digest and may make you feel nauseous or bloated. Drink plenty of fluids but you should avoid alcoholic beverages for 24 hours. If you had a esophageal dilation, please see attached instructions for diet.    ACTIVITY: Your care partner should take you home directly after the procedure. You should plan to take it easy, moving slowly for the rest of the day. You can resume normal activity the day after the procedure however YOU SHOULD NOT DRIVE, use power tools, machinery or perform tasks that involve climbing or major physical exertion for 24 hours (because of the sedation medicines used during the test).   Following upper endoscopy (EGD) Vomiting of blood or coffee ground material  New, significant abdominal pain  New, significant chest pain or pain under the shoulder blades  Painful or persistently difficult swallowing  New shortness of breath  Black, tarry-looking or red, bloody stools  FOLLOW UP:  IPlease call with any specific questions about appointments  or follow up tests.

## 2019-08-15 NOTE — H&P (Signed)
HPI: This is a very pleasant 49 yo woman with waxing and waning severe abd pain, reflux symptoms.  Here for Bravo 48 hour wireless pH testing ON antiacid meds (currently pepcid 40mg  BID sometimes TID and tagamet PRN).    PPI meds seemed to have been causing worsening of her abd pains.  ROS: complete GI ROS as described in HPI, all other review negative.  Constitutional:  No unintentional weight loss   Past Medical History:  Diagnosis Date  . Anxiety   . Asthma   . Barrett's esophagus   . Fibromyalgia   . Functional ovarian cysts   . GERD (gastroesophageal reflux disease)   . Headache    migraine  . Hypertension   . Pneumothorax   . RA (rheumatoid arthritis) ([place])     Past Surgical History:  Procedure Laterality Date  . CHOLECYSTECTOMY    . ovarian tumor    . TONSILLECTOMY      Current Facility-Administered Medications  Medication Dose Route Frequency Provider Last Rate Last Admin  . 0.9 %  sodium chloride infusion   Intravenous Continuous Milus Banister, MD      . lactated ringers infusion    Continuous PRN Milus Banister, MD 20 mL/hr at 08/15/19 1348 1,000 mL at 08/15/19 1348    Allergies as of 07/18/2019 - Review Complete 07/09/2019  Allergen Reaction Noted  . Ciprofloxacin  07/25/2016  . Codeine  07/25/2016  . Diclofenac sodium  07/25/2016  . Doxycycline hyclate  07/25/2016  . Erythromycin  07/25/2016  . Iodine  07/25/2016  . Levaquin [levofloxacin in d5w]  07/25/2016  . Nubain [nalbuphine hcl]  07/25/2016  . Zithromax [azithromycin]  07/25/2016  . Penicillins Rash 07/25/2016    Family History  Problem Relation Age of Onset  . Rheumatologic disease Father   . Tremor Father   . Colon polyps Father   . Heart attack Paternal Grandfather   . Colon cancer Maternal Uncle        died 46  . Colon cancer Paternal Grandmother        died 28  . Colon polyps Paternal Aunt     Social History   Socioeconomic History  . Marital status: Married    Spouse  name: Not on file  . Number of children: 2  . Years of education: Not on file  . Highest education level: Not on file  Occupational History  . Occupation: Nurse, children's  Tobacco Use  . Smoking status: Never Smoker  . Smokeless tobacco: Never Used  Substance and Sexual Activity  . Alcohol use: Yes    Alcohol/week: 3.0 standard drinks    Types: 3 Glasses of wine per week    Comment: 3 GLASSES A WEEK   . Drug use: No  . Sexual activity: Yes    Partners: Male    Birth control/protection: I.U.D.    Comment: 1st intercourse- 23, partners- 93, married- 56 yrs   Other Topics Concern  . Not on file  Social History Narrative  . Not on file   Social Determinants of Health   Financial Resource Strain:   . Difficulty of Paying Living Expenses:   Food Insecurity:   . Worried About Charity fundraiser in the Last Year:   . Arboriculturist in the Last Year:   Transportation Needs:   . Film/video editor (Medical):   [name] Kitchen Lack of Transportation (Non-Medical):   Physical Activity:   . Days of Exercise per Week:   .  Minutes of Exercise per Session:   Stress:   . Feeling of Stress :   Social Connections:   . Frequency of Communication with Friends and Family:   . Frequency of Social Gatherings with Friends and Family:   . Attends Religious Services:   . Active Member of Clubs or Organizations:   . Attends Archivist Meetings:   [name] Kitchen Marital Status:   Intimate Partner Violence:   . Fear of Current or Ex-Partner:   . Emotionally Abused:   [name] Kitchen Physically Abused:   . Sexually Abused:      Physical Exam: BP (!) 148/96   Pulse 79   Temp 98 F (36.7 C) (Oral)   Resp 17   Ht 5\' 3"  (1.6 m)   Wt 59 kg   SpO2 99%   BMI 23.04 kg/m  Constitutional: generally well-appearing Psychiatric: alert and oriented x3 Abdomen: soft, nontender, nondistended, no obvious ascites, no peritoneal signs, normal bowel sounds No peripheral edema noted in lower  extremities  Assessment and plan: 49 y.o. female with waxing, waning abd pains, acid symptoms  For EGD with Bravo pH probe placement today.  Please see the "Patient Instructions" section for addition details about the plan.  Owens Loffler, MD [facility] 08/15/2019, 2:47 PM

## 2019-08-16 ENCOUNTER — Telehealth: Payer: Self-pay | Admitting: Gastroenterology

## 2019-08-16 ENCOUNTER — Encounter: Payer: Self-pay | Admitting: *Deleted

## 2019-08-16 MED ORDER — AMBULATORY NON FORMULARY MEDICATION: 30.0000 mL | Freq: Two times a day (BID) | 0 refills | Status: AC

## 2019-08-16 NOTE — Anesthesia Postprocedure Evaluation (Signed)
Anesthesia Post Note  Patient: SELIN EISLER  Procedure(s) Performed: ESOPHAGOGASTRODUODENOSCOPY (EGD) WITH PROPOFOL (N/A ) BRAVO PH STUDY (N/A )     Patient location during evaluation: Endoscopy Anesthesia Type: MAC Level of consciousness: awake and alert Pain management: pain level controlled Vital Signs Assessment: post-procedure vital signs reviewed and stable Respiratory status: spontaneous breathing, nonlabored ventilation, respiratory function stable and patient connected to nasal cannula oxygen Cardiovascular status: blood pressure returned to baseline and stable Postop Assessment: no apparent nausea or vomiting Anesthetic complications: no    Last Vitals:  Vitals:   08/15/19 1530 08/15/19 1540  BP: (!) 135/91 (!) 141/93  Pulse: 79   Resp: (!) 24 15  Temp:    SpO2: 99% 99%    Last Pain:  Vitals:   08/15/19 1540  TempSrc:   PainSc: 6                  Pariss Hommes L Jorene Kaylor

## 2019-08-16 NOTE — Telephone Encounter (Signed)
The pt called with complaint of esophageal pain and "spasm" that started right after EGD/bravo yesterday.  I spoke with Dr Ardis Hughs over the phone and he recommends the pt have GI cocktail sent to the pharmacy to try 30 ml BID.  Prescription sent and pt aware.  She will call over the weekend if the pain worsens.

## 2019-08-16 NOTE — Telephone Encounter (Signed)
I spoke with the pharmacy and he states the dicyclomine is not used in GI cocktail as frequently anymore and wanted to substitute benadryl.  Ok was given to use that formula.

## 2019-08-16 NOTE — Telephone Encounter (Signed)
[facility] called saying that have script for Dicyclomine with no other info.

## 2019-08-22 ENCOUNTER — Telehealth: Payer: Self-pay | Admitting: Gastroenterology

## 2019-08-22 NOTE — Telephone Encounter (Signed)
I reviewed her pH study and it shows her acid reflux is under very good control on her current antiacid regimen. She should continue that regimen without change.  Ask how she is feeling, I know she had some pain after the capsule was placed.  Has that resolved??

## 2019-08-22 NOTE — Telephone Encounter (Signed)
I spoke with the pt and she says she feels much better and will keep medications as directed.

## 2019-10-24 ENCOUNTER — Other Ambulatory Visit: Payer: Self-pay | Admitting: Obstetrics & Gynecology

## 2019-10-24 DIAGNOSIS — Z1231 Encounter for screening mammogram for malignant neoplasm of breast: Secondary | ICD-10-CM

## 2019-12-13 ENCOUNTER — Other Ambulatory Visit: Payer: Self-pay

## 2019-12-13 ENCOUNTER — Ambulatory Visit
Admission: RE | Admit: 2019-12-13 | Discharge: 2019-12-13 | Disposition: A | Discharge status: Home / Self Care | Payer: No Typology Code available for payment source | Source: Ambulatory Visit | Attending: Obstetrics & Gynecology | Admitting: Obstetrics & Gynecology

## 2019-12-13 DIAGNOSIS — Z1231 Encounter for screening mammogram for malignant neoplasm of breast: Secondary | ICD-10-CM

## 2019-12-15 ENCOUNTER — Other Ambulatory Visit: Payer: Self-pay | Admitting: Physician Assistant

## 2019-12-15 MED ORDER — PREDNISONE 20 MG PO TABS: ORAL_TABLET | ORAL | 0 refills | Status: DC

## 2019-12-15 MED ORDER — ONDANSETRON HCL 4 MG PO TABS: 4.0000 mg | ORAL_TABLET | Freq: Every day | ORAL | 1 refills | Status: DC | PRN

## 2019-12-15 MED ORDER — HYOSCYAMINE SULFATE SL 0.125 MG SL SUBL: 0.1250 mg | SUBLINGUAL_TABLET | SUBLINGUAL | 0 refills | Status: DC | PRN

## 2019-12-16 ENCOUNTER — Other Ambulatory Visit: Payer: Self-pay | Admitting: Physician Assistant

## 2019-12-16 MED ORDER — CEFUROXIME AXETIL 500 MG PO TABS: 500.0000 mg | ORAL_TABLET | Freq: Two times a day (BID) | ORAL | 0 refills | Status: DC

## 2019-12-18 ENCOUNTER — Other Ambulatory Visit: Payer: Self-pay | Admitting: Obstetrics & Gynecology

## 2019-12-18 ENCOUNTER — Other Ambulatory Visit: Payer: Self-pay

## 2019-12-18 ENCOUNTER — Ambulatory Visit
Admission: RE | Admit: 2019-12-18 | Discharge: 2019-12-18 | Disposition: A | Discharge status: Home / Self Care | Payer: No Typology Code available for payment source | Source: Ambulatory Visit | Attending: Obstetrics & Gynecology | Admitting: Obstetrics & Gynecology

## 2019-12-18 DIAGNOSIS — N63 Unspecified lump in unspecified breast: Secondary | ICD-10-CM

## 2020-01-07 ENCOUNTER — Other Ambulatory Visit: Payer: Self-pay

## 2020-01-07 ENCOUNTER — Ambulatory Visit (INDEPENDENT_AMBULATORY_CARE_PROVIDER_SITE_OTHER): Payer: No Typology Code available for payment source

## 2020-01-07 ENCOUNTER — Ambulatory Visit (INDEPENDENT_AMBULATORY_CARE_PROVIDER_SITE_OTHER): Payer: No Typology Code available for payment source | Admitting: Podiatry

## 2020-01-07 ENCOUNTER — Encounter: Payer: Self-pay | Admitting: Podiatry

## 2020-01-07 ENCOUNTER — Encounter: Payer: Self-pay | Admitting: Neurology

## 2020-01-07 DIAGNOSIS — M778 Other enthesopathies, not elsewhere classified: Secondary | ICD-10-CM | POA: Diagnosis not present

## 2020-01-07 NOTE — Progress Notes (Signed)
She presents today chief complaint of forefoot pain left.  States that it is sharp shooting pains as I come over the top of my foot been going on for about a week and a half now is still swollen and puffy and I feel a clicking sensation as the toe bends up.  She states that is this 1 as she points to the third toe states that she has been taping the third toe and the fourth toe together.  Denies any trauma does state that she had just come back from [place] as she did a lot of walking in her flip-flops and sandals and on the sand.  Objective: Vital signs are stable alert oriented x3.  There is no erythema to some mild edema no cellulitis drainage or odor she does have pain on end range of motion of the third toe on dorsiflexion and plantar flexion at the metatarsophalangeal joint.  There is mild tenderness on direct palpation of this area.  Radiographs do not demonstrate any type of significant osseous abnormalities in this area that is secondary tarsal does demonstrate an old stress fracture this, would heal uneventfully.  Assessment: Most likely it is going to be capsulitis however she does demonstrate some symptoms of neuroma third interdigital space as well.  Plan: At this point I injected dexamethasone and local anesthetic to the point of maximal tenderness and placed her in a Darco shoe.  I will follow-up with her on an as-needed basis.

## 2020-01-14 ENCOUNTER — Ambulatory Visit: Payer: No Typology Code available for payment source | Admitting: Podiatry

## 2020-01-21 NOTE — Progress Notes (Signed)
Assessment/Plan:   1.  Essential Tremor.  -This is evidenced by the symmetrical nature and longstanding hx of gradually getting worse.  We discussed nature and pathophysiology.  We discussed that this can continue to gradually get worse with time.  We discussed that some medications can worsen this, as can caffeine use.   -We discussed that she is really already on the maximum dose of primidone that has been shown to be effective for tremor. I would not recommend increasing this. She can continue primidone, 250 mg twice daily. This should not be stopped cold [place], as it can result in a seizure.  -She has already tried propranolol, 50 mg and it caused blood pressure lower.  -Discussed that propranolol and primidone are the only first-line medications for tremor. Discussed second line medications. Ultimately decided on trihexyphenidyl. Discussed anticholinergic side effects. We will just start with 2 mg in the morning with breakfast. If she tolerates that, she can call me in 2 weeks and we can try and increase the dose from there.  -Potentially anxiety could be increasing tremor, which increases all types of tremors.  2.  Follow up is anticipated in the next 4-6 months, sooner should new neurologic issues arise.    Subjective:   [name] was seen in consultation in the movement disorder clinic at the request of Cari Caraway, MD.  The evaluation is for tremor.  Patient previously saw Dr. Rexene Alberts for the same.  Records from Dr. Rexene Alberts from 2018 are reviewed.  Patient was diagnosed with essential tremor.  She was already on primidone, 250 mg daily and did not think higher dosages helped (note that when she went to work conference she took primidone, 500 mg in the morning and 250 in the evening).  She had previously been on beta-blocker therapy (propranolol, 50 mg dose) but had lowering of blood pressure.    Tremor started in childhood (age of 49 y/o).  It involves the bilateral UE.   Tremor is most noticeable when using the hands.  Both hands shake equally.  She is L hand dominant.  She notices it most with fine motor coordination.   There is a family hx of tremor in father.    Affected by caffeine:  Yes.   (24 oz coffee per day - increases tremor) Affected by alcohol:  Yes.   (glass of wine every other day - decreases tremor) Affected by stress:  Yes.  , increases tremor, including physical stress Affected by fatigue:  No. Spills soup if on spoon:  No. Spills glass of liquid if full:  May or may not Affects ADL's (tying shoes, brushing teeth, etc):  No.  Current/Previously tried tremor medications: Propranolol (unknown dose, lowered blood pressure); ? not Topamax candidate due to history of nephrolithiasis - incidental finding only - never clinical syndrome - found on scan; primidone - 250 mg bid (states that wasn't consistent with this dosing until about 4 months ago  Current medications that may exacerbate tremor:  n/a  Outside reports reviewed: historical medical records, office notes and referral letter/letters.  Allergies  Allergen Reactions   Zithromax [Azithromycin]    Ciprofloxacin Rash   Codeine Rash   Diclofenac Sodium Rash   Doxycycline Hyclate     Upset stomach   Erythromycin     Upset stomach   Levaquin [Levofloxacin In D5w] Rash   Nubain [Nalbuphine Hcl] Rash   Penicillins Rash    Did it involve swelling of the face/tongue/throat, SOB, or low BP? No  Did it involve sudden or severe rash/hives, skin peeling, or any reaction on the inside of your mouth or nose? Yes Did you need to seek medical attention at a hospital or doctor's office? Yes When did it last happen?20 Years If all above answers are NO, may proceed with cephalosporin use.     Current Outpatient Medications  Medication Instructions   acetaminophen (TYLENOL) 1,000 mg, Oral, Every 6 hours PRN   cetirizine (ZYRTEC) 10 mg, Oral, Daily at bedtime   cimetidine  (TAGAMET) 300 mg, Oral, 3 times daily PRN   Enbrel 50 mg, Subcutaneous, Every Fri   famotidine (PEPCID) 40 mg, Oral, 2 times daily   FLUoxetine (PROZAC) 20 mg, Oral, Daily   levonorgestrel (MIRENA) 20 MCG/24HR IUD 1 each, Intrauterine,  Once   LORazepam (ATIVAN) 0.25 mg, Oral, Daily PRN   primidone (MYSOLINE) 250 mg, Oral, 2 times daily   trihexyphenidyl (ARTANE) 2 mg, Oral, Daily with breakfast     Objective:   VITALS:   Vitals:   01/24/20 0949  BP: 127/86  Pulse: 91  SpO2: 98%  Weight: 143 lb (64.9 kg)  Height: 5\' 3"  (1.6 m)   Gen:  Appears stated age and in NAD. HEENT:  Normocephalic, atraumatic. The mucous membranes are moist. The superficial temporal arteries are without ropiness or tenderness. Cardiovascular: Regular rate and rhythm. Lungs: Clear to auscultation bilaterally. Neck: There are no carotid bruits noted bilaterally.  NEUROLOGICAL:  Orientation:  The patient is alert and oriented x 3.   Cranial nerves: There is good facial symmetry. Extraocular muscles are intact and visual fields are full to confrontational testing. Speech is fluent and clear. Soft palate rises symmetrically and there is no tongue deviation. Hearing is intact to conversational tone. Tone: Tone is good throughout. Sensation: Sensation is intact to light touch touch throughout (facial, trunk, extremities). Vibration is intact at the bilateral big toe. There is no extinction with double simultaneous stimulation. There is no sensory dermatomal level identified. Coordination:  The patient has no dysdiadichokinesia or dysmetria. Motor: Strength is 5/5 in the bilateral upper and lower extremities.  Shoulder shrug is equal bilaterally.  There is no pronator drift.  There are no fasciculations noted. DTR's: Deep tendon reflexes are 2/4 at the bilateral biceps, triceps, brachioradialis, 2+ at the bilateral patella and 2/4 at the bilateral achilles.  Plantar responses are downgoing bilaterally. Gait  and Station: The patient is able to ambulate without difficulty. The patient is able to heel toe walk without any difficulty. The patient is able to ambulate in a tandem fashion. The patient is able to stand in the Romberg position.   MOVEMENT EXAM: Tremor:  There is  tremor in the UE, noted most significantly with action.  The patient is not able to draw Archimedes spirals without significant difficulty, especially on the left.  There is no tremor at rest.  The patient is not able to pour water from one glass to another without spilling it.  I have reviewed and interpreted the following labs independently   Chemistry      Component Value Date/Time   NA 139 06/11/2019 1545   K 3.2 (L) 06/11/2019 1545   CL 103 06/11/2019 1545   CO2 28 06/11/2019 1545   BUN 15 06/11/2019 1545   CREATININE 0.76 06/11/2019 1545      Component Value Date/Time   CALCIUM 9.4 06/11/2019 1545   ALKPHOS 54 06/11/2019 1545   AST 24 06/11/2019 1545   ALT 26 06/11/2019 1545  BILITOT 0.3 06/11/2019 1545      Lab Results  Component Value Date   WBC 4.3 06/11/2019   HGB 14.0 06/11/2019   HCT 41.1 06/11/2019   MCV 92.2 06/11/2019   PLT 217.0 06/11/2019   No results found for: TSH    Total time spent on today's visit was 45 minutes, including both face-to-face time and nonface-to-face time.  Time included that spent on review of records (prior notes available to me/labs/imaging if pertinent), discussing treatment and goals, answering patient's questions and coordinating care.  CC:  Cari Caraway, MD

## 2020-01-23 ENCOUNTER — Other Ambulatory Visit: Payer: Self-pay

## 2020-01-23 MED ORDER — SUCRALFATE 1 G PO TABS: 1.0000 g | ORAL_TABLET | Freq: Four times a day (QID) | ORAL | 3 refills | Status: DC

## 2020-01-24 ENCOUNTER — Other Ambulatory Visit: Payer: Self-pay

## 2020-01-24 ENCOUNTER — Encounter: Payer: Self-pay | Admitting: Neurology

## 2020-01-24 ENCOUNTER — Ambulatory Visit (INDEPENDENT_AMBULATORY_CARE_PROVIDER_SITE_OTHER): Payer: No Typology Code available for payment source | Admitting: Neurology

## 2020-01-24 VITALS — BP 127/86 | HR 91 | Ht 63.0 in | Wt 143.0 lb

## 2020-01-24 DIAGNOSIS — G250 Essential tremor: Secondary | ICD-10-CM | POA: Diagnosis not present

## 2020-01-24 MED ORDER — TRIHEXYPHENIDYL HCL 2 MG PO TABS
2.0000 mg | ORAL_TABLET | Freq: Every day | ORAL | 1 refills | Status: DC
Start: 2020-01-24 — End: 2020-07-20

## 2020-01-24 NOTE — Patient Instructions (Signed)
1.  Take primidone 250 mg twice per day 2.  Start trihexyphenidyl - 2 mg - 1 tablet in the AM.  If you are doing well, stay with this.  If not and no side effects, call me in 2 weeks and we can potentially increase the medication  The physicians and staff at [facility] are committed to providing excellent care. You may receive a survey requesting feedback about your experience at our office. We strive to receive "very good" responses to the survey questions. If you feel that your experience would prevent you from giving the office a "very good " response, please contact our office to try to remedy the situation. We may be reached at [phone]. Thank you for taking the time out of your busy day to complete the survey.

## 2020-02-11 ENCOUNTER — Ambulatory Visit: Payer: No Typology Code available for payment source | Admitting: Podiatry

## 2020-02-27 ENCOUNTER — Ambulatory Visit: Payer: No Typology Code available for payment source | Admitting: Podiatry

## 2020-04-18 ENCOUNTER — Other Ambulatory Visit: Payer: Self-pay | Admitting: Gastroenterology

## 2020-04-20 ENCOUNTER — Telehealth: Payer: Self-pay

## 2020-04-20 NOTE — Telephone Encounter (Signed)
We received Rx refill request for carafate.  Per patient she no longer needs carafate.  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 She does have some complaints of nausea.  She would like rx for zofran.    Please advise.  Thank you, Elmyra Ricks

## 2020-04-20 NOTE — Telephone Encounter (Signed)
Left message on voicemail for patient to return call to inform our office of what pharmacy she would prefer medication to be sent to.  Will continue efforts.

## 2020-04-20 NOTE — Telephone Encounter (Signed)
Ok, offer zofran 4mg  pills, take one pill BID PRN nausea. Disp 50 with 6 refills.  Thanks

## 2020-04-21 MED ORDER — ONDANSETRON HCL 4 MG PO TABS
4.0000 mg | ORAL_TABLET | Freq: Two times a day (BID) | ORAL | 6 refills | Status: DC | PRN
Start: 2020-04-21 — End: 2021-01-29

## 2020-04-21 NOTE — Addendum Note (Signed)
Addended by: Stevan Born on: 04/21/2020 04:46 PM   Modules accepted: Orders

## 2020-04-21 NOTE — Telephone Encounter (Signed)
Patient would like zofran sent to [facility] in [place]. Rx sent as requested.

## 2020-06-12 ENCOUNTER — Encounter: Payer: No Typology Code available for payment source | Admitting: Obstetrics & Gynecology

## 2020-07-19 ENCOUNTER — Other Ambulatory Visit: Payer: Self-pay | Admitting: Neurology

## 2020-07-20 NOTE — Telephone Encounter (Signed)
Rx(s) sent to pharmacy electronically.  

## 2020-07-23 NOTE — Progress Notes (Deleted)
Assessment/Plan:    1.  Essential Tremor  ***  Subjective:   [name] was seen today in follow up for essential tremor.  My previous records were reviewed prior to todays visit.  We started trihexyphenidyl.  She sent me a message 5 days later stating that she thought it made tremor worse.  I told her I thought that would be unusual, but that she certainly could stop it if she would like.  I did not hear from him after that.  She reports today that ***.pt denies falls.  Pt denies lightheadedness, near syncope.  No hallucinations.  Mood has been good.  Current prescribed movement disorder medications: ***Trihexyphenidyl, 2 mg daily (started last visit) Primidone, 250 mg twice per day  PREVIOUS MEDICATIONS: Propranolol, 50 mg (lowered blood pressure)  ALLERGIES:   Allergies  Allergen Reactions  . Zithromax [Azithromycin]   . Ciprofloxacin Rash  . Codeine Rash  . Diclofenac Sodium Rash  . Doxycycline Hyclate     Upset stomach  . Erythromycin     Upset stomach  . Levaquin [Levofloxacin In D5w] Rash  . Nubain [Nalbuphine Hcl] Rash  . Penicillins Rash    Did it involve swelling of the face/tongue/throat, SOB, or low BP? No Did it involve sudden or severe rash/hives, skin peeling, or any reaction on the inside of your mouth or nose? Yes Did you need to seek medical attention at a hospital or doctor's office? Yes When did it last happen?20 Years If all above answers are "NO", may proceed with cephalosporin use.     CURRENT MEDICATIONS:  Outpatient Encounter Medications as of 07/28/2020  Medication Sig  . acetaminophen (TYLENOL) 500 MG tablet Take 1,000 mg by mouth every 6 (six) hours as needed for moderate pain or headache.  . cetirizine (ZYRTEC) 10 MG tablet Take 10 mg by mouth at bedtime.   . cimetidine (TAGAMET) 300 MG tablet Take 300 mg by mouth 3 (three) times daily as needed (heartburn).  [name] Kitchen etanercept (ENBREL) 50 MG/ML injection Inject 50 mg into the  skin every Friday.  . famotidine (PEPCID) 40 MG tablet Take 40 mg by mouth in the morning and at bedtime.  [name] Kitchen FLUoxetine (PROZAC) 20 MG capsule Take 20 mg by mouth daily.   [name] Kitchen levonorgestrel (MIRENA) 20 MCG/24HR IUD 1 each by Intrauterine route once.  [name] Kitchen LORazepam (ATIVAN) 0.5 MG tablet Take 0.25 mg by mouth daily as needed for anxiety.   . ondansetron (ZOFRAN) 4 MG tablet Take 1 tablet (4 mg total) by mouth 2 (two) times daily as needed for nausea or vomiting.  . primidone (MYSOLINE) 250 MG tablet Take 250 mg by mouth in the morning and at bedtime.   . trihexyphenidyl (ARTANE) 2 MG tablet TAKE 1 TABLET BY MOUTH DAILY WITH BREAKFAST   No facility-administered encounter medications on file as of 07/28/2020.     Objective:    PHYSICAL EXAMINATION:    VITALS:  There were no vitals filed for this visit.  GEN:  The patient appears stated age and is in NAD. HEENT:  Normocephalic, atraumatic.  The mucous membranes are moist. The superficial temporal arteries are without ropiness or tenderness. CV:  RRR Lungs:  CTAB Neck/HEME:  There are no carotid bruits bilaterally.  Neurological examination:  Orientation: The patient is alert and oriented x3. Cranial nerves: There is good facial symmetry. The speech is fluent and clear. Soft palate rises symmetrically and there is no tongue deviation. Hearing is intact to conversational tone. Sensation:  Sensation is intact to light touch throughout Motor: Strength is at least antigravity x4.  Movement examination: Tone: There is normal tone in the UE/LE Abnormal movements: *** Coordination:  There is *** decremation with RAM's, *** Gait and Station: The patient has *** difficulty arising out of a deep-seated chair without the use of the hands. The patient's stride length is good I have reviewed and interpreted the following labs independently   Chemistry      Component Value Date/Time   NA 139 06/11/2019 1545   K 3.2 (L) 06/11/2019 1545   CL 103  06/11/2019 1545   CO2 28 06/11/2019 1545   BUN 15 06/11/2019 1545   CREATININE 0.76 06/11/2019 1545      Component Value Date/Time   CALCIUM 9.4 06/11/2019 1545   ALKPHOS 54 06/11/2019 1545   AST 24 06/11/2019 1545   ALT 26 06/11/2019 1545   BILITOT 0.3 06/11/2019 1545      Lab Results  Component Value Date   WBC 4.3 06/11/2019   HGB 14.0 06/11/2019   HCT 41.1 06/11/2019   MCV 92.2 06/11/2019   PLT 217.0 06/11/2019   No results found for: TSH   Chemistry      Component Value Date/Time   NA 139 06/11/2019 1545   K 3.2 (L) 06/11/2019 1545   CL 103 06/11/2019 1545   CO2 28 06/11/2019 1545   BUN 15 06/11/2019 1545   CREATININE 0.76 06/11/2019 1545      Component Value Date/Time   CALCIUM 9.4 06/11/2019 1545   ALKPHOS 54 06/11/2019 1545   AST 24 06/11/2019 1545   ALT 26 06/11/2019 1545   BILITOT 0.3 06/11/2019 1545         Total time spent on today's visit was ***30 minutes, including both face-to-face time and nonface-to-face time.  Time included that spent on review of records (prior notes available to me/labs/imaging if pertinent), discussing treatment and goals, answering patient's questions and coordinating care.  Cc:  Cari Caraway, MD

## 2020-07-28 ENCOUNTER — Ambulatory Visit: Payer: No Typology Code available for payment source | Admitting: Obstetrics & Gynecology

## 2020-07-28 ENCOUNTER — Ambulatory Visit: Payer: No Typology Code available for payment source | Admitting: Neurology

## 2020-10-05 ENCOUNTER — Ambulatory Visit (INDEPENDENT_AMBULATORY_CARE_PROVIDER_SITE_OTHER): Payer: No Typology Code available for payment source | Admitting: Obstetrics & Gynecology

## 2020-10-05 ENCOUNTER — Encounter: Payer: Self-pay | Admitting: Obstetrics & Gynecology

## 2020-10-05 ENCOUNTER — Other Ambulatory Visit: Payer: Self-pay

## 2020-10-05 VITALS — BP 110/78 | Ht 63.0 in | Wt 145.0 lb

## 2020-10-05 DIAGNOSIS — R102 Pelvic and perineal pain: Secondary | ICD-10-CM

## 2020-10-05 DIAGNOSIS — R635 Abnormal weight gain: Secondary | ICD-10-CM | POA: Diagnosis not present

## 2020-10-05 DIAGNOSIS — Z30431 Encounter for routine checking of intrauterine contraceptive device: Secondary | ICD-10-CM | POA: Diagnosis not present

## 2020-10-05 DIAGNOSIS — N951 Menopausal and female climacteric states: Secondary | ICD-10-CM

## 2020-10-05 MED ORDER — ESTRADIOL 0.05 MG/24HR TD PTWK: 0.0500 mg | MEDICATED_PATCH | TRANSDERMAL | 4 refills | Status: DC

## 2020-10-05 NOTE — Progress Notes (Signed)
    [name] 31-Oct-1970 [id number]        50 y.o.  G2P2L2 Married  RP: Unexplained weight gain/not able to loose weight  HPI: C/O weight gain of 12 Lbs in the past year, not able to loose it in spite of intermittent fasting and ketone diet.  Exercising regularly.  The weight is mainly at the abdomen/waist.  Mild hot flushes intermittently.  Well on Mirena IUD x 2020 with occasional BTB.  Started back on the Estradiol patch 0.05 weekly 2 weeks ago to facilitate weight loss, but notes now that the patch fell off, not sure when.     OB History  Gravida Para Term Preterm AB Living  2 2       2   SAB IAB Ectopic Multiple Live Births               # Outcome Date GA Lbr Len/2nd Weight Sex Delivery Anes PTL Lv  2 Para           1 Para             Past medical history,surgical history, problem list, medications, allergies, family history and social history were all reviewed and documented in the EPIC chart.   Directed ROS with pertinent positives and negatives documented in the history of present illness/assessment and plan.  Exam:  Vitals:   10/05/20 0945  BP: 110/78  Weight: 145 lb (65.8 kg)  Height: 5\' 3"  (1.6 m)   General appearance:  Normal  Abdomen: Normal  Gynecologic exam: Vulva normal.  Bimanual exam: Uterus AV, normal volume, mobile, mild tenderness.  IUD strings felt at [facility].  No adnexal mass, NT bilaterally.   Assessment/Plan:  50 y.o. G2P2   1. Menopausal syndrome Perimenopausal or menopausal with weight gain and difficulty losing weight for the first time in her life.  Mild hot flashes on and off.  Well on Mirena IUD since 2020.  Restarted on estradiol patch 0.05 weekly.  No contraindication to hormone replacement therapy.  Mirena IUD in good position per exam and estradiol patch prescription sent to pharmacy.  Will verify [place] level today. - FSH  2. Unexplained weight gain Unexplained weight gain, possibly related to perimenopause or menopause.  Rule  out hypothyroidism with a TSH today. - TSH  3. Pelvic pain in female Pelvic discomfort, will further investigate with a pelvic ultrasound at follow-up. - US Transvaginal Non-OB; Future  4. Encounter for routine checking of intrauterine contraceptive device (IUD) Mirena IUD since 2020, in good position with no sign of infection.  Other orders - estradiol (CLIMARA - DOSED IN MG/24 HR) 0.05 mg/24hr patch; Place 1 patch (0.05 mg total) onto the skin once a week.  Princess Bruins MD, 10:06 AM 10/05/2020

## 2020-10-06 LAB — TSH: TSH: 1.55 mIU/L

## 2020-10-06 LAB — FOLLICLE STIMULATING HORMONE: FSH: 22.7 m[IU]/mL

## 2020-10-13 ENCOUNTER — Telehealth: Payer: Self-pay

## 2020-10-13 NOTE — Telephone Encounter (Signed)
-----   Message from Princess Bruins, MD sent at 10/09/2020  3:19 PM EDT ----- TSH normal FSH mild increase at 22.7.  Probably perimenopausal.

## 2020-10-13 NOTE — Telephone Encounter (Signed)
Patient informed of result note. She asked "so does Dr. Dellis Filbert want me to wear the Estrogen patch?"

## 2020-10-14 NOTE — Telephone Encounter (Signed)
Spoke with patient and informed her. °

## 2020-10-29 ENCOUNTER — Other Ambulatory Visit: Payer: No Typology Code available for payment source | Admitting: Obstetrics & Gynecology

## 2020-10-29 ENCOUNTER — Other Ambulatory Visit: Payer: No Typology Code available for payment source

## 2020-11-18 IMAGING — CT CT ABD-PELV W/ CM
2 of 5 series · 15 of 46 positions shown, 17 images · IV contrast (OMNIPAQUE 300)
Comparison: 11/30/2006

CLINICAL DATA: One month history of upper abdominal pain, unable to
eat due to pain, pain with pressure, history reflux and
cholecystectomy

EXAM:
CT ABDOMEN AND PELVIS WITH CONTRAST
TECHNIQUE: Multidetector CT imaging of the abdomen and pelvis was performed
using the standard protocol following bolus administration of
intravenous contrast. Sagittal and coronal MPR images reconstructed
from axial data set.
CONTRAST:  100mL OMNIPAQUE IOHEXOL 300 MG/ML SOLN IV. Dilute oral
contrast.

[Series 2: abd/pel w · axial · 0.62mm/px · z∈[-460,-70]mm · 12 of 88 slices shown, 14 images]
[im 5/88  soft-tissue]
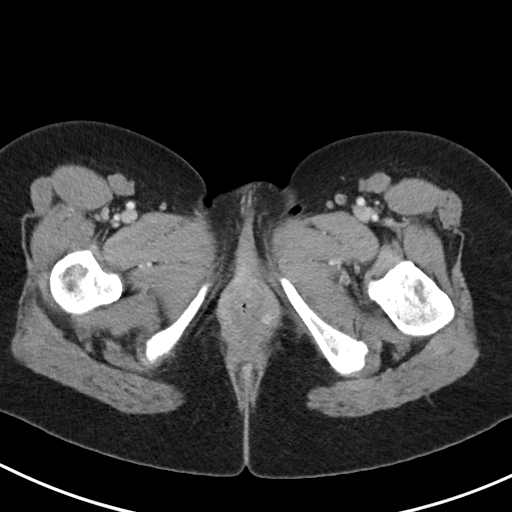
[im 5/88  bone]
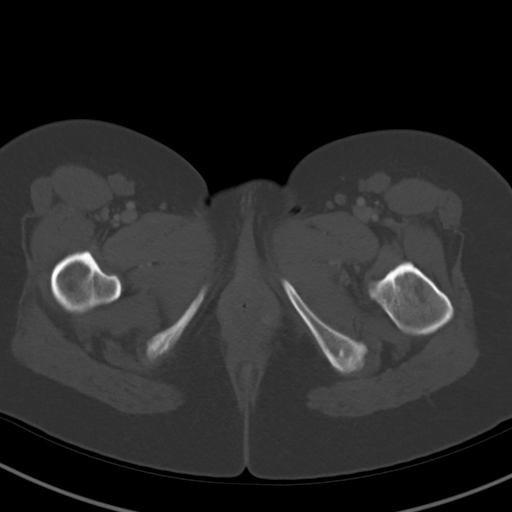
[im 14/88  soft-tissue]
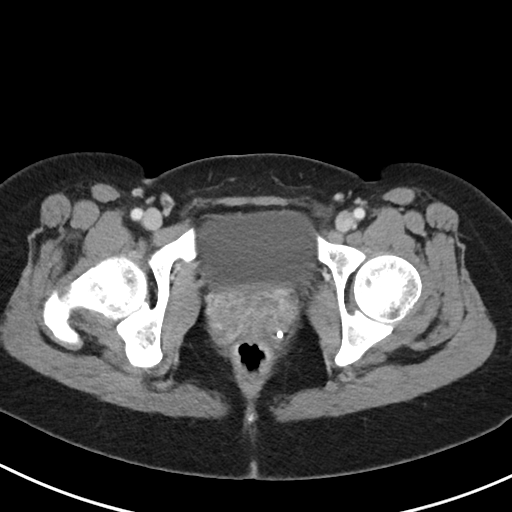
[im 19/88  soft-tissue]
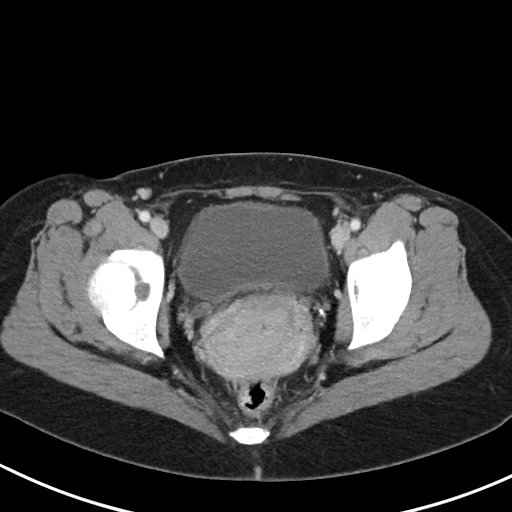
[im 28/88  soft-tissue]
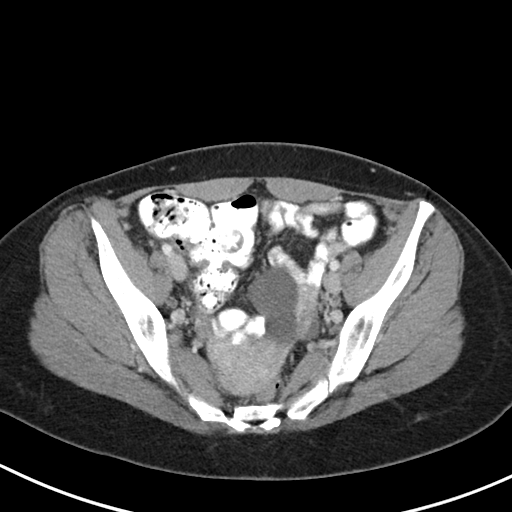
[im 33/88  soft-tissue]
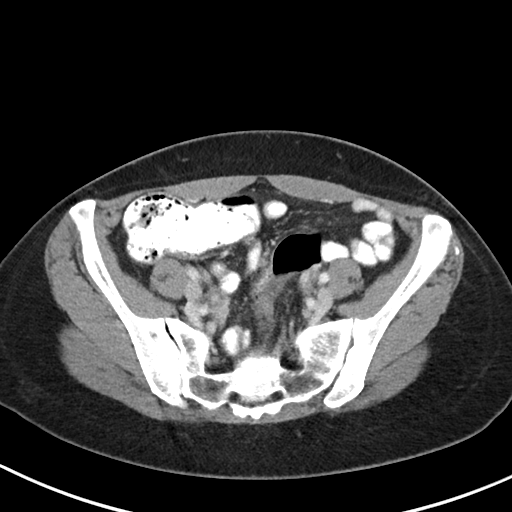
[im 42/88  soft-tissue]
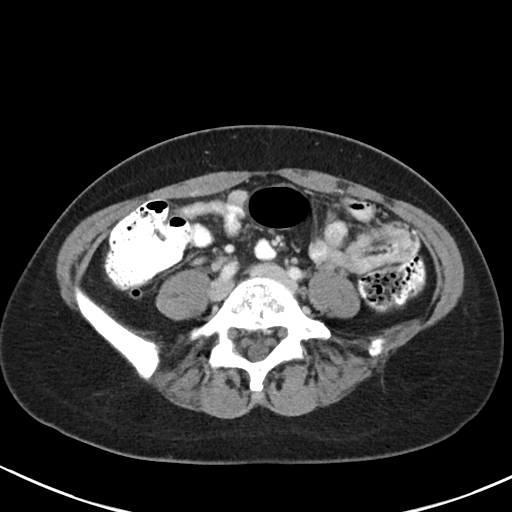
[im 46/88  soft-tissue]
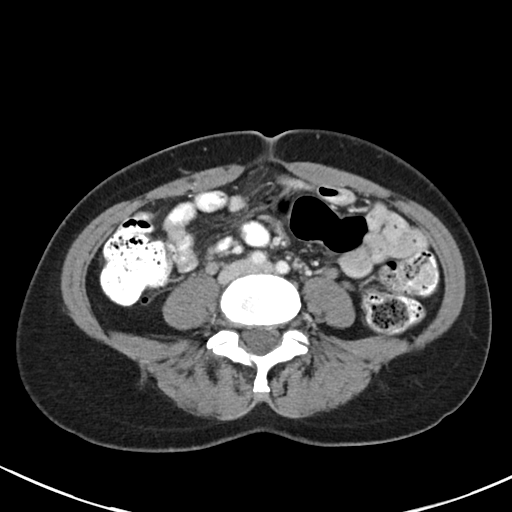
[im 55/88  soft-tissue]
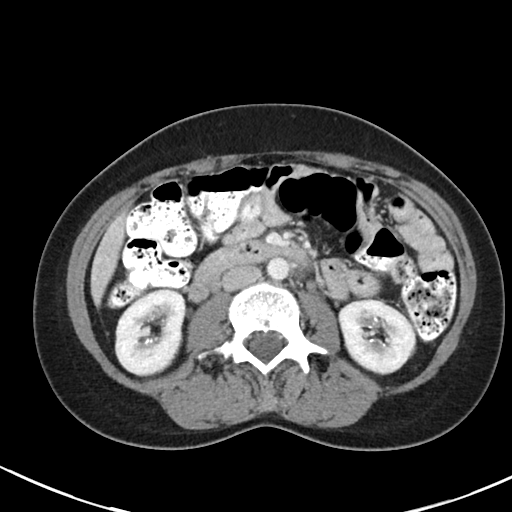
[im 60/88  soft-tissue]
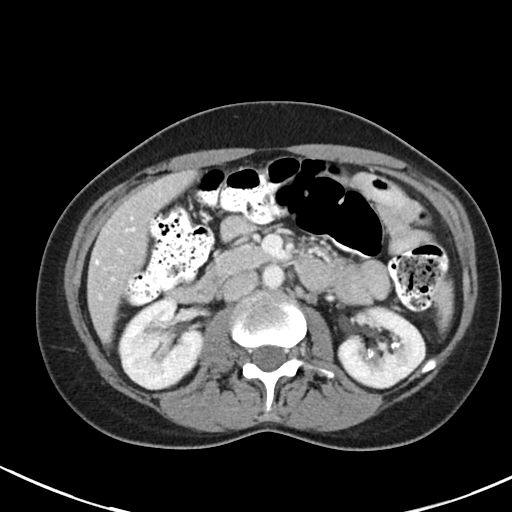
[im 60/88  bone]
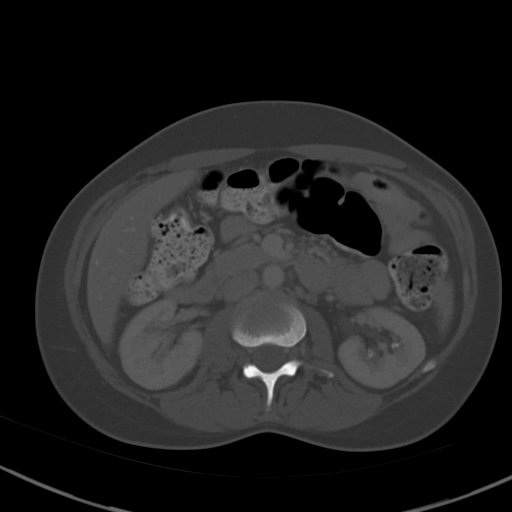
[im 69/88  soft-tissue]
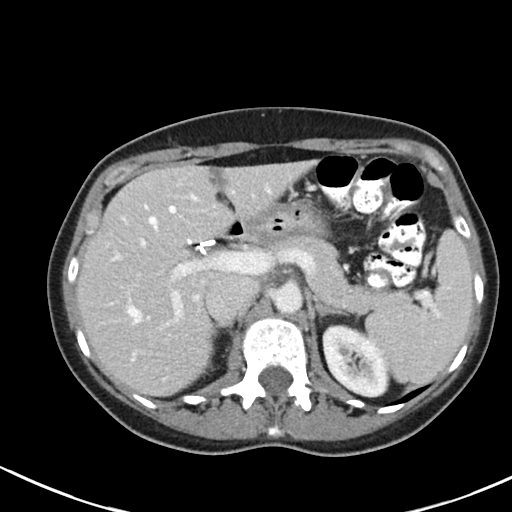
[im 74/88  soft-tissue]
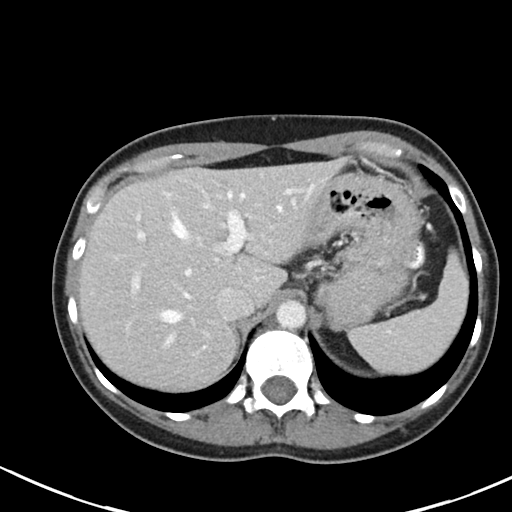
[im 83/88  soft-tissue]
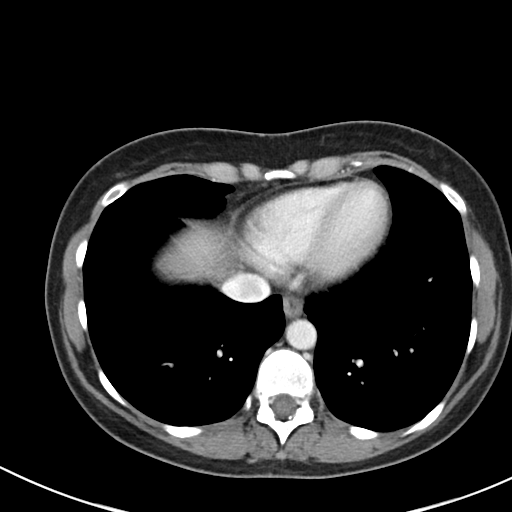

[Series 5: abd/pel w st · coronal · 0.63mm/px · 3 of 63 slices shown]
[im 21/63  soft-tissue]
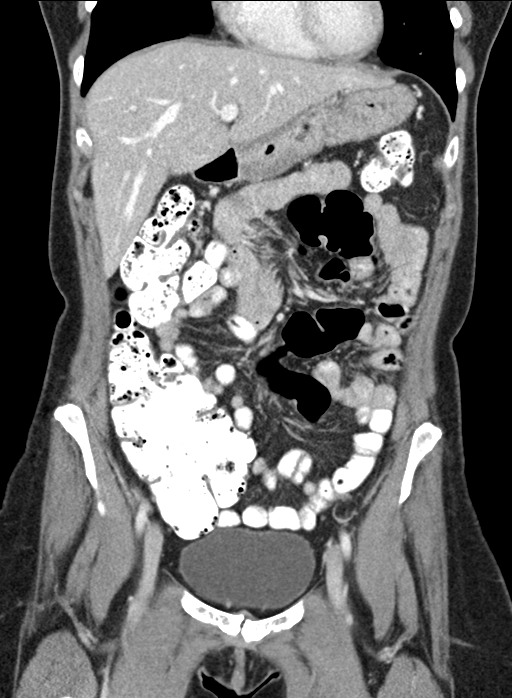
[im 28/63  soft-tissue]
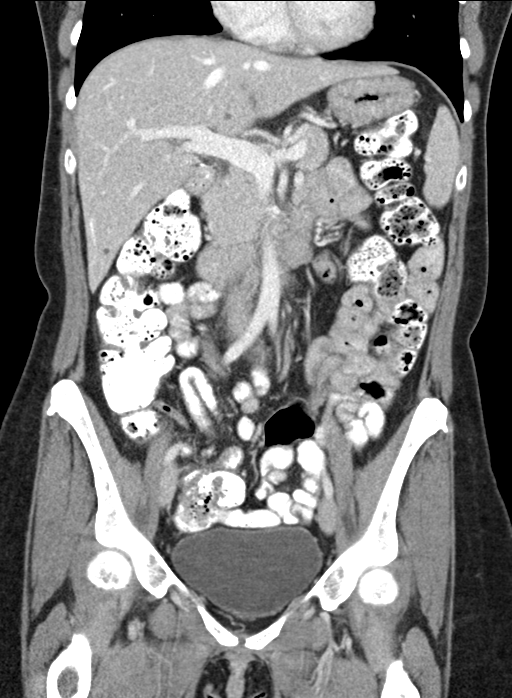
[im 35/63  soft-tissue]
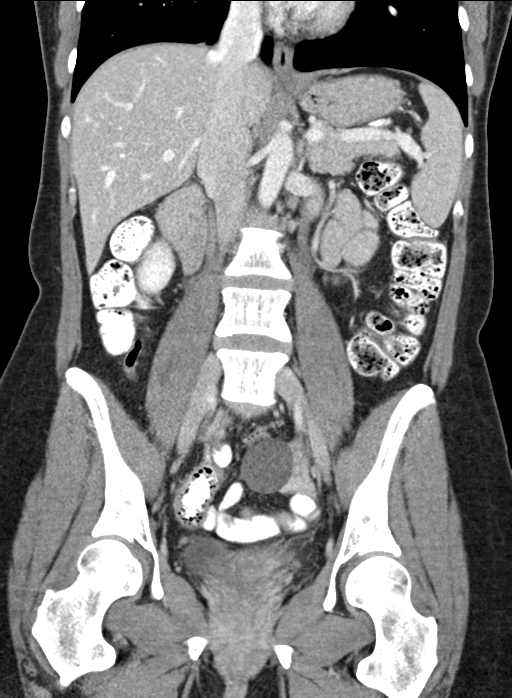

[15 of 46 positions shown; findings below may reference images not displayed]

FINDINGS: Lower chest: Lung bases clear

Hepatobiliary: Mild focal fatty infiltration of liver adjacent to
falciform fissure. Cyst anterior liver at medial segment LEFT lobe
12 x 8 mm image 22. Liver otherwise unremarkable. Gallbladder
surgically absent. No biliary dilatation.

Pancreas: Normal appearance

Spleen: Tiny cyst within spleen. Otherwise normal appearance.

Adrenals/Urinary Tract: Adrenal glands normal appearance. Multiple
BILATERAL nonobstructing renal calculi. No renal mass or
hydronephrosis. Ureters and bladder unremarkable.

Stomach/Bowel: Stomach underdistended limiting assessment. However
gastric wall appears potentially mildly thickened unable to exclude
gastritis, greatest at antrum. Normal appendix. Large and small
bowel loops otherwise unremarkable.

Vascular/Lymphatic: Aorta normal caliber. Vascular structures
patent. No adenopathy.

Reproductive: IUD within retroverted uterus. Unremarkable RIGHT
ovary. Septated cyst versus adjacent cysts within LEFT ovary in
aggregate measuring 5.2 x 3.4 x 3.3 cm.

Other: Small amount of nonspecific free pelvic fluid in cul-de-sac.
No free air. Tiny umbilical hernia containing fat.

Musculoskeletal: Unremarkable
IMPRESSION: Multiple BILATERAL nonobstructing renal calculi.

Septated cyst versus adjacent cysts within LEFT ovary 5.2 x 3.4 x
3.3 cm; followup sonographic characterization recommended to exclude
cystic ovarian neoplasm.

Small amount of nonspecific free pelvic fluid.

Question gastric wall thickening though this could be related to
underdistention, recommend correlation with symptoms of gastritis
and consider EGD.

Tiny umbilical hernia containing fat.

## 2020-12-01 NOTE — Progress Notes (Deleted)
Assessment/Plan:    1.  Essential Tremor  ***Patient currently on the max primidone dose that I would recommend for tremor, 250 mg twice per day.   -Patient previously tried propranolol and it lowered her blood pressure.  -Patient thought trihexyphenidyl made her worse.  -***topiramate   Subjective:   [name] was seen today in follow up for essential tremor.  My previous records were reviewed prior to todays visit. Pt started on trihexyphenidyl last visit.  About 5 days later, the patient stated that she thought it made her tremors worse.  I told her I thought I would personally give this more time, but I thought it would be up to her on whether or not she would continue it.  She is now off of the medication.  Current prescribed movement disorder medications: ***Primidone, 250 mg twice per day Trihexyphenidyl, 2 mg (patient stopped it as she thought it made tremor worse)   PREVIOUS MEDICATIONS: {Parkinson's RX:18200} propranolol, 50 mg (lowered blood pressure); primidone  ALLERGIES:   Allergies  Allergen Reactions   Zithromax [Azithromycin]    Ciprofloxacin Rash   Codeine Rash   Diclofenac Sodium Rash   Doxycycline Hyclate     Upset stomach   Erythromycin     Upset stomach   Levaquin [Levofloxacin In D5w] Rash   Nubain [Nalbuphine Hcl] Rash   Penicillins Rash    Did it involve swelling of the face/tongue/throat, SOB, or low BP? No Did it involve sudden or severe rash/hives, skin peeling, or any reaction on the inside of your mouth or nose? Yes Did you need to seek medical attention at a hospital or doctor's office? Yes When did it last happen?      20 Years If all above answers are "NO", may proceed with cephalosporin use.     CURRENT MEDICATIONS:  Outpatient Encounter Medications as of 12/04/2020  Medication Sig   acetaminophen (TYLENOL) 500 MG tablet Take 1,000 mg by mouth every 6 (six) hours as needed for moderate pain or headache.   cetirizine  (ZYRTEC) 10 MG tablet Take 10 mg by mouth at bedtime.    estradiol (CLIMARA - DOSED IN MG/24 HR) 0.05 mg/24hr patch Place 1 patch (0.05 mg total) onto the skin once a week.   etanercept (ENBREL) 50 MG/ML injection Inject 50 mg into the skin every Friday.   famotidine (PEPCID) 40 MG tablet Take 40 mg by mouth in the morning and at bedtime.   FLUoxetine (PROZAC) 20 MG capsule Take 20 mg by mouth daily.    levonorgestrel (MIRENA) 20 MCG/24HR IUD 1 each by Intrauterine route once.   LORazepam (ATIVAN) 0.5 MG tablet Take 0.25 mg by mouth daily as needed for anxiety.    ondansetron (ZOFRAN) 4 MG tablet Take 1 tablet (4 mg total) by mouth 2 (two) times daily as needed for nausea or vomiting.   primidone (MYSOLINE) 250 MG tablet Take 250 mg by mouth in the morning and at bedtime.    No facility-administered encounter medications on file as of 12/04/2020.     Objective:    PHYSICAL EXAMINATION:    VITALS:  There were no vitals filed for this visit.  GEN:  The patient appears stated age and is in NAD. HEENT:  Normocephalic, atraumatic.  The mucous membranes are moist. The superficial temporal arteries are without ropiness or tenderness. CV:  RRR Lungs:  CTAB Neck/HEME:  There are no carotid bruits bilaterally.  Neurological examination:  Orientation: The patient is alert and  oriented x3. Cranial nerves: There is good facial symmetry. The speech is fluent and clear. Soft palate rises symmetrically and there is no tongue deviation. Hearing is intact to conversational tone. Sensation: Sensation is intact to light touch throughout Motor: Strength is at least antigravity x4.  Movement examination: Tone: There is normal tone in the UE/LE Abnormal movements: *** Coordination:  There is *** decremation with RAM's, *** Gait and Station: The patient has *** difficulty arising out of a deep-seated chair without the use of the hands. The patient's stride length is good I have reviewed and interpreted  the following labs independently   Chemistry      Component Value Date/Time   NA 139 06/11/2019 1545   K 3.2 (L) 06/11/2019 1545   CL 103 06/11/2019 1545   CO2 28 06/11/2019 1545   BUN 15 06/11/2019 1545   CREATININE 0.76 06/11/2019 1545      Component Value Date/Time   CALCIUM 9.4 06/11/2019 1545   ALKPHOS 54 06/11/2019 1545   AST 24 06/11/2019 1545   ALT 26 06/11/2019 1545   BILITOT 0.3 06/11/2019 1545      Lab Results  Component Value Date   WBC 4.3 06/11/2019   HGB 14.0 06/11/2019   HCT 41.1 06/11/2019   MCV 92.2 06/11/2019   PLT 217.0 06/11/2019   Lab Results  Component Value Date   TSH 1.55 10/05/2020     Chemistry      Component Value Date/Time   NA 139 06/11/2019 1545   K 3.2 (L) 06/11/2019 1545   CL 103 06/11/2019 1545   CO2 28 06/11/2019 1545   BUN 15 06/11/2019 1545   CREATININE 0.76 06/11/2019 1545      Component Value Date/Time   CALCIUM 9.4 06/11/2019 1545   ALKPHOS 54 06/11/2019 1545   AST 24 06/11/2019 1545   ALT 26 06/11/2019 1545   BILITOT 0.3 06/11/2019 1545         Total time spent on today's visit was ***30 minutes, including both face-to-face time and nonface-to-face time.  Time included that spent on review of records (prior notes available to me/labs/imaging if pertinent), discussing treatment and goals, answering patient's questions and coordinating care.  Cc:  Cari Caraway, MD

## 2020-12-04 ENCOUNTER — Ambulatory Visit: Payer: No Typology Code available for payment source | Admitting: Neurology

## 2021-01-22 NOTE — Progress Notes (Deleted)
Assessment/Plan:     1.  Essential Tremor  ***Patient currently on the max primidone dose that I would recommend for tremor, 250 mg twice per day.   -Patient previously tried propranolol and it lowered her blood pressure.  -Patient thought trihexyphenidyl made her worse.  -***topiramate Subjective:   [name] was seen today in follow up for essential tremor.  My previous records were reviewed prior to todays visit. . Pt started on trihexyphenidyl last visit.  About 5 days later, the patient stated that she thought it made her tremors worse.  I told her I thought I would personally give this more time, but I thought it would be up to her on whether or not she would continue it.  She is now off of the medication.  She was last seen about 1 year ago.  Current prescribed movement disorder medications: ***Primidone, 250 mg twice per day Trihexyphenidyl, 2 mg (patient stopped it as she thought it made tremor worse)   PREVIOUS MEDICATIONS:  propranolol, 50 mg (lowered blood pressure); primidone   PREVIOUS MEDICATIONS: {Parkinson's RX:18200}  ALLERGIES:   Allergies  Allergen Reactions   Zithromax [Azithromycin]    Ciprofloxacin Rash   Codeine Rash   Diclofenac Sodium Rash   Doxycycline Hyclate     Upset stomach   Erythromycin     Upset stomach   Levaquin [Levofloxacin In D5w] Rash   Nubain [Nalbuphine Hcl] Rash   Penicillins Rash    Did it involve swelling of the face/tongue/throat, SOB, or low BP? No Did it involve sudden or severe rash/hives, skin peeling, or any reaction on the inside of your mouth or nose? Yes Did you need to seek medical attention at a hospital or doctor's office? Yes When did it last happen?      20 Years If all above answers are "NO", may proceed with cephalosporin use.     CURRENT MEDICATIONS:  Outpatient Encounter Medications as of 01/25/2021  Medication Sig   acetaminophen (TYLENOL) 500 MG tablet Take 1,000 mg by mouth every 6  (six) hours as needed for moderate pain or headache.   cetirizine (ZYRTEC) 10 MG tablet Take 10 mg by mouth at bedtime.    estradiol (CLIMARA - DOSED IN MG/24 HR) 0.05 mg/24hr patch Place 1 patch (0.05 mg total) onto the skin once a week.   etanercept (ENBREL) 50 MG/ML injection Inject 50 mg into the skin every Friday.   famotidine (PEPCID) 40 MG tablet Take 40 mg by mouth in the morning and at bedtime.   FLUoxetine (PROZAC) 20 MG capsule Take 20 mg by mouth daily.    levonorgestrel (MIRENA) 20 MCG/24HR IUD 1 each by Intrauterine route once.   LORazepam (ATIVAN) 0.5 MG tablet Take 0.25 mg by mouth daily as needed for anxiety.    ondansetron (ZOFRAN) 4 MG tablet Take 1 tablet (4 mg total) by mouth 2 (two) times daily as needed for nausea or vomiting.   primidone (MYSOLINE) 250 MG tablet Take 250 mg by mouth in the morning and at bedtime.    No facility-administered encounter medications on file as of 01/25/2021.     Objective:    PHYSICAL EXAMINATION:    VITALS:  There were no vitals filed for this visit.  GEN:  The patient appears stated age and is in NAD. HEENT:  Normocephalic, atraumatic.  The mucous membranes are moist. The superficial temporal arteries are without ropiness or tenderness. CV:  RRR Lungs:  CTAB Neck/HEME:  There are  no carotid bruits bilaterally.  Neurological examination:  Orientation: The patient is alert and oriented x3. Cranial nerves: There is good facial symmetry. The speech is fluent and clear. Soft palate rises symmetrically and there is no tongue deviation. Hearing is intact to conversational tone. Sensation: Sensation is intact to light touch throughout Motor: Strength is at least antigravity x4.  Movement examination: Tone: There is normal tone in the UE/LE Abnormal movements: *** Coordination:  There is *** decremation with RAM's, *** Gait and Station: The patient has *** difficulty arising out of a deep-seated chair without the use of the hands.  The patient's stride length is good I have reviewed and interpreted the following labs independently   Chemistry      Component Value Date/Time   NA 139 06/11/2019 1545   K 3.2 (L) 06/11/2019 1545   CL 103 06/11/2019 1545   CO2 28 06/11/2019 1545   BUN 15 06/11/2019 1545   CREATININE 0.76 06/11/2019 1545      Component Value Date/Time   CALCIUM 9.4 06/11/2019 1545   ALKPHOS 54 06/11/2019 1545   AST 24 06/11/2019 1545   ALT 26 06/11/2019 1545   BILITOT 0.3 06/11/2019 1545      Lab Results  Component Value Date   WBC 4.3 06/11/2019   HGB 14.0 06/11/2019   HCT 41.1 06/11/2019   MCV 92.2 06/11/2019   PLT 217.0 06/11/2019   Lab Results  Component Value Date   TSH 1.55 10/05/2020     Chemistry      Component Value Date/Time   NA 139 06/11/2019 1545   K 3.2 (L) 06/11/2019 1545   CL 103 06/11/2019 1545   CO2 28 06/11/2019 1545   BUN 15 06/11/2019 1545   CREATININE 0.76 06/11/2019 1545      Component Value Date/Time   CALCIUM 9.4 06/11/2019 1545   ALKPHOS 54 06/11/2019 1545   AST 24 06/11/2019 1545   ALT 26 06/11/2019 1545   BILITOT 0.3 06/11/2019 1545         Total time spent on today's visit was ***30 minutes, including both face-to-face time and nonface-to-face time.  Time included that spent on review of records (prior notes available to me/labs/imaging if pertinent), discussing treatment and goals, answering patient's questions and coordinating care.  Cc:  Cari Caraway, MD

## 2021-01-25 ENCOUNTER — Ambulatory Visit: Payer: No Typology Code available for payment source | Admitting: Neurology

## 2021-01-25 NOTE — Progress Notes (Signed)
Assessment/Plan:     1.  Essential Tremor  -Patient will continue on primidone, 250 mg daily.  She was on 250 mg twice daily but decreased it with working at home.  The biggest issue with her hand tremor was when she was out in public and traveling for her job.  She now works remotely.  -Patient previously tried propranolol and it lowered her blood pressure.  -Patient thought trihexyphenidyl made her worse.  -She asked if there was any medicine for tremor that would help weight loss.  We discussed topiramate, along with risks and benefits.  However, after some discussion, we ultimately decided to hold off on that.  2.  Follow-up 1 year Subjective:   [name] was seen today in follow up for essential tremor.  My previous records were reviewed prior to todays visit. . Pt started on trihexyphenidyl last visit.  About 5 days later, the patient stated that she thought it made her tremors worse.  I told her I thought I would personally give this more time, but I thought it would be up to her on whether or not she would continue it.  She is now off of the medication.  She was last seen about 1 year ago.  She states that she decreased her primidone to once per day b/c she is working from home and was not traveling as much, so not as many people would notice her tremor.  States that she has been going through menopause and having hot flashes.  She has gained a lot of weight because of that and is frustrated.  Current prescribed movement disorder medications: Primidone, 250 mg twice per day (pt reports only once per day) Trihexyphenidyl, 2 mg (patient stopped it as she thought it made tremor worse)   PREVIOUS MEDICATIONS:  propranolol, 50 mg (lowered blood pressure); primidone; artane (states that it made tremor worse)    ALLERGIES:   Allergies  Allergen Reactions   Zithromax [Azithromycin]    Ciprofloxacin Rash   Codeine Rash   Diclofenac Sodium Rash   Doxycycline Hyclate      Upset stomach   Erythromycin     Upset stomach   Levaquin [Levofloxacin In D5w] Rash   Nubain [Nalbuphine Hcl] Rash   Penicillins Rash    Did it involve swelling of the face/tongue/throat, SOB, or low BP? No Did it involve sudden or severe rash/hives, skin peeling, or any reaction on the inside of your mouth or nose? Yes Did you need to seek medical attention at a hospital or doctor's office? Yes When did it last happen?      20 Years If all above answers are "NO", may proceed with cephalosporin use.     CURRENT MEDICATIONS:  Outpatient Encounter Medications as of 02/03/2021  Medication Sig   cetirizine (ZYRTEC) 10 MG tablet Take 10 mg by mouth at bedtime.    ENBREL SURECLICK 50 MG/ML injection Inject into the skin.   estradiol (VIVELLE-DOT) 0.1 MG/24HR patch Place 1 patch (0.1 mg total) onto the skin 2 (two) times a week.   famotidine (PEPCID) 40 MG tablet Take 40 mg by mouth in the morning and at bedtime.   FLUoxetine (PROZAC) 10 MG capsule Take 10 mg by mouth daily.   levonorgestrel (MIRENA) 20 MCG/24HR IUD 1 each by Intrauterine route once.   LORazepam (ATIVAN) 0.5 MG tablet Take 0.25 mg by mouth daily as needed for anxiety.    ondansetron (ZOFRAN-ODT) 4 MG disintegrating tablet Take 4 mg  by mouth every 6 (six) hours as needed.   primidone (MYSOLINE) 250 MG tablet Take 250 mg by mouth once.   [DISCONTINUED] acetaminophen (TYLENOL) 500 MG tablet Take 1,000 mg by mouth every 6 (six) hours as needed for moderate pain or headache.   [DISCONTINUED] estradiol (CLIMARA - DOSED IN MG/24 HR) 0.05 mg/24hr patch Place 1 patch (0.05 mg total) onto the skin once a week.   [DISCONTINUED] etanercept (ENBREL) 50 MG/ML injection Inject 50 mg into the skin every Friday.   [DISCONTINUED] FLUoxetine (PROZAC) 20 MG capsule Take 20 mg by mouth daily.    [DISCONTINUED] ondansetron (ZOFRAN) 4 MG tablet Take 1 tablet (4 mg total) by mouth 2 (two) times daily as needed for nausea or vomiting.   No  facility-administered encounter medications on file as of 02/03/2021.     Objective:    PHYSICAL EXAMINATION:    VITALS:   Vitals:   02/03/21 0956  BP: 122/78  Pulse: 71  SpO2: 97%  Weight: 146 lb 6.4 oz (66.4 kg)  Height: '5\' 3"'$  (1.6 m)    GEN:  The patient appears stated age and is in NAD. HEENT:  Normocephalic, atraumatic.  The mucous membranes are moist. The superficial temporal arteries are without ropiness or tenderness. CV:  RRR Lungs:  CTAB Neck/HEME:  There are no carotid bruits bilaterally.  Neurological examination:  Orientation: The patient is alert and oriented x3. Cranial nerves: There is good facial symmetry. The speech is fluent and clear. Soft palate rises symmetrically and there is no tongue deviation. Hearing is intact to conversational tone. Sensation: Sensation is intact to light touch throughout Motor: Strength is 5/5 in the upper and lower extremities.  Movement examination: Tone: There is normal tone in the UE/LE Abnormal movements: Mild postural tremor.  Mild intention tremor.  No significant trouble with Archimedes spirals.  I have reviewed and interpreted the following labs independently   Chemistry      Component Value Date/Time   NA 139 06/11/2019 1545   K 3.2 (L) 06/11/2019 1545   CL 103 06/11/2019 1545   CO2 28 06/11/2019 1545   BUN 15 06/11/2019 1545   CREATININE 0.76 06/11/2019 1545      Component Value Date/Time   CALCIUM 9.4 06/11/2019 1545   ALKPHOS 54 06/11/2019 1545   AST 24 06/11/2019 1545   ALT 26 06/11/2019 1545   BILITOT 0.3 06/11/2019 1545      Lab Results  Component Value Date   WBC 4.3 06/11/2019   HGB 14.0 06/11/2019   HCT 41.1 06/11/2019   MCV 92.2 06/11/2019   PLT 217.0 06/11/2019   Lab Results  Component Value Date   TSH 1.60 01/29/2021     Chemistry      Component Value Date/Time   NA 139 06/11/2019 1545   K 3.2 (L) 06/11/2019 1545   CL 103 06/11/2019 1545   CO2 28 06/11/2019 1545   BUN 15  06/11/2019 1545   CREATININE 0.76 06/11/2019 1545      Component Value Date/Time   CALCIUM 9.4 06/11/2019 1545   ALKPHOS 54 06/11/2019 1545   AST 24 06/11/2019 1545   ALT 26 06/11/2019 1545   BILITOT 0.3 06/11/2019 1545         Total time spent on today's visit was 20 minutes, including both face-to-face time and nonface-to-face time.  Time included that spent on review of records (prior notes available to me/labs/imaging if pertinent), discussing treatment and goals, answering patient's questions and coordinating care.  Cc:  Cari Caraway, MD

## 2021-01-29 ENCOUNTER — Telehealth: Payer: Self-pay | Admitting: *Deleted

## 2021-01-29 ENCOUNTER — Other Ambulatory Visit: Payer: Self-pay

## 2021-01-29 ENCOUNTER — Encounter: Payer: Self-pay | Admitting: Obstetrics & Gynecology

## 2021-01-29 ENCOUNTER — Ambulatory Visit (INDEPENDENT_AMBULATORY_CARE_PROVIDER_SITE_OTHER): Payer: No Typology Code available for payment source | Admitting: Obstetrics & Gynecology

## 2021-01-29 ENCOUNTER — Other Ambulatory Visit: Payer: Self-pay | Admitting: Obstetrics & Gynecology

## 2021-01-29 VITALS — BP 118/74 | HR 82 | Resp 16 | Ht 62.75 in | Wt 144.0 lb

## 2021-01-29 DIAGNOSIS — Z30431 Encounter for routine checking of intrauterine contraceptive device: Secondary | ICD-10-CM | POA: Diagnosis not present

## 2021-01-29 DIAGNOSIS — R5383 Other fatigue: Secondary | ICD-10-CM

## 2021-01-29 DIAGNOSIS — R635 Abnormal weight gain: Secondary | ICD-10-CM

## 2021-01-29 DIAGNOSIS — Z01419 Encounter for gynecological examination (general) (routine) without abnormal findings: Secondary | ICD-10-CM | POA: Diagnosis not present

## 2021-01-29 DIAGNOSIS — N951 Menopausal and female climacteric states: Secondary | ICD-10-CM | POA: Diagnosis not present

## 2021-01-29 MED ORDER — ESTRADIOL 0.1 MG/24HR TD PTTW
1.0000 | MEDICATED_PATCH | TRANSDERMAL | 4 refills | Status: DC
Start: 2021-02-01 — End: 2021-11-08

## 2021-01-29 NOTE — Progress Notes (Signed)
[name] 1971/05/17 [id number]   History:    50 y.o. G2P2L2 Married.  Son 25 yo, daughter 39 yo.   RP:  Established patient presenting for annual gyn exam    HPI: Severe hot flushes and night sweats even on Climara 0.05.  Stopped it x 1 week to check [facility].  Weight gain in spite of fitness activities, low calorie/carb diet and intermittent fasting.  BMI 25.71.  Well on Mirena IUD x 06/2018.  No abnormal vaginal discharge.  No recent IC.  H/O LEEP about 15 yrs ago.  Last Pap 06/2019 Negative/HPV HR Neg. Urine/BMs normal.  Breasts normal.  Health Labs with Fam MD.     Past medical history,surgical history, family history and social history were all reviewed and documented in the EPIC chart.  Gynecologic History No LMP recorded. (Menstrual status: IUD). Obstetric History OB History  Gravida Para Term Preterm AB Living  '2 2       2  '$ SAB IAB Ectopic Multiple Live Births               # Outcome Date GA Lbr Len/2nd Weight Sex Delivery Anes PTL Lv  2 Para           1 Para              ROS: A ROS was performed and pertinent positives and negatives are included in the history.  GENERAL: No fevers or chills. HEENT: No change in vision, no earache, sore throat or sinus congestion. NECK: No pain or stiffness. CARDIOVASCULAR: No chest pain or pressure. No palpitations. PULMONARY: No shortness of breath, cough or wheeze. GASTROINTESTINAL: No abdominal pain, nausea, vomiting or diarrhea, melena or bright red blood per rectum. GENITOURINARY: No urinary frequency, urgency, hesitancy or dysuria. MUSCULOSKELETAL: No joint or muscle pain, no back pain, no recent trauma. DERMATOLOGIC: No rash, no itching, no lesions. ENDOCRINE: No polyuria, polydipsia, no heat or cold intolerance. No recent change in weight. HEMATOLOGICAL: No anemia or easy bruising or bleeding. NEUROLOGIC: No headache, seizures, numbness, tingling or weakness. PSYCHIATRIC: No depression, no loss of interest in normal activity or  change in sleep pattern.     Exam:   Ht 5' 2.75" (1.594 m)   Wt 144 lb (65.3 kg)   BMI 25.71 kg/m   Body mass index is 25.71 kg/m.  General appearance : Well developed well nourished female. No acute distress HEENT: Eyes: no retinal hemorrhage or exudates,  Neck supple, trachea midline, no carotid bruits, no thyroidmegaly Lungs: Clear to auscultation, no rhonchi or wheezes, or rib retractions  Heart: Regular rate and rhythm, no murmurs or gallops Breast:Examined in sitting and supine position were symmetrical in appearance, no palpable masses or tenderness,  no skin retraction, no nipple inversion, no nipple discharge, no skin discoloration, no axillary or supraclavicular lymphadenopathy Abdomen: no palpable masses or tenderness, no rebound or guarding Extremities: no edema or skin discoloration or tenderness  Pelvic: Vulva: Normal             Vagina: No gross lesions or discharge  Cervix: No gross lesions or discharge.  IUD strings felt at the [facility].  Uterus  AV, normal size, shape and consistency, non-tender and mobile  Adnexa  Without masses or tenderness  Anus: Normal   Assessment/Plan:  50 y.o. female for annual exam   1. Well female exam with routine gynecological exam Normal gynecologic exam.  Pap test January 2021 was negative with HPV high-risk negative, will repeat at 2  years.  Remote history of LEEP, Pap tests normal since then.  Breast exam normal.  Will schedule screening mammogram now.  Colonoscopy 2016.  Health labs with family physician.  2. Encounter for routine checking of intrauterine contraceptive device (IUD) Mirena IUD well tolerated x 2020 and in good position.  3. Menopausal syndrome Severe hot flashes and night sweats on estradiol 0.05 patch.  Decision to change to Vivelle-Dot 0.1 twice a week.  We will continue on the Mirena IUD for endometrial protection.  No contraindication to hormone replacement therapy.  Weight gain, unable to lose it back.  Will  verify [facility] today after 1 week off the estradiol patch.  Fluoxetine may be contributing to weight gain issues. - FSH  4. Unexplained weight gain Continue with low calorie/carb diet and intermittent fasting.  Aerobic activities 5 times a week with weight lifting every 2 days.  Will rule out thyroid dysfunction with a TSH.  Fluoxetine may be contributing to weight gain issues. - TSH  Other orders - FLUoxetine (PROZAC) 10 MG capsule; Take 10 mg by mouth daily. - ondansetron (ZOFRAN-ODT) 4 MG disintegrating tablet; Take 4 mg by mouth every 6 (six) hours as needed. - ENBREL SURECLICK 50 MG/ML injection; Inject into the skin. - estradiol (VIVELLE-DOT) 0.1 MG/24HR patch; Place 1 patch (0.1 mg total) onto the skin 2 (two) times a week.   Princess Bruins MD, 10:37 AM 01/29/2021

## 2021-01-29 NOTE — Telephone Encounter (Signed)
Error

## 2021-01-30 ENCOUNTER — Encounter: Payer: Self-pay | Admitting: Obstetrics & Gynecology

## 2021-01-30 LAB — FOLLICLE STIMULATING HORMONE: FSH: 127.7 m[IU]/mL — ABNORMAL HIGH

## 2021-01-30 LAB — TSH: TSH: 1.6 mIU/L

## 2021-02-03 ENCOUNTER — Encounter: Payer: Self-pay | Admitting: Neurology

## 2021-02-03 ENCOUNTER — Other Ambulatory Visit: Payer: Self-pay

## 2021-02-03 ENCOUNTER — Ambulatory Visit (INDEPENDENT_AMBULATORY_CARE_PROVIDER_SITE_OTHER): Payer: No Typology Code available for payment source | Admitting: Neurology

## 2021-02-03 VITALS — BP 122/78 | HR 71 | Ht 63.0 in | Wt 146.4 lb

## 2021-02-03 DIAGNOSIS — G250 Essential tremor: Secondary | ICD-10-CM | POA: Diagnosis not present

## 2021-02-17 ENCOUNTER — Telehealth: Payer: Self-pay | Admitting: Neurology

## 2021-02-17 NOTE — Telephone Encounter (Signed)
Patient called and said she is wanting to switch to topamax from primidone as discussed with Dr. Carles Collet.  90 day increment  [facility]

## 2021-02-18 MED ORDER — TOPIRAMATE 100 MG PO TABS
100.0000 mg | ORAL_TABLET | Freq: Two times a day (BID) | ORAL | 2 refills | Status: DC
Start: 2021-02-18 — End: 2021-03-08

## 2021-02-18 MED ORDER — TOPIRAMATE 25 MG PO TABS: ORAL_TABLET | ORAL | 0 refills | Status: DC

## 2021-02-18 NOTE — Telephone Encounter (Signed)
Can start topamax as follows: Topamax, 25 mg, 1 tablet daily for 1 week, then 2 tablets daily for 1 week and then 3 tablets daily for 1 week and then the 25 mg will run out and she will take topamax 100 mg, 1 pill daily.  I sent both RX to her pharm

## 2021-02-18 NOTE — Telephone Encounter (Signed)
Called patient and went over dosing to ramp up for Topamax. Patient had a few questions about side effects. I did let her know hat I knew about side effects and let her know to reach out with any questions. Patient agreed

## 2021-03-08 ENCOUNTER — Other Ambulatory Visit: Payer: Self-pay | Admitting: Family Medicine

## 2021-03-08 ENCOUNTER — Telehealth: Payer: Self-pay | Admitting: Neurology

## 2021-03-08 DIAGNOSIS — Z1231 Encounter for screening mammogram for malignant neoplasm of breast: Secondary | ICD-10-CM

## 2021-03-08 NOTE — Telephone Encounter (Signed)
Pt c/o:  side effect on medication New medication started: 02/18/21 When did they start medication?  02/18/21 When did side effects start? 3 days Side effects reported: She is having blurred vision to where she cannot see and extreme nausea. Extreme fatigue ,itchy, tingling sensation, restless leg and insomnia when she went from 25-50. It is working for her tremors at this time. Still taking medication? Yes.   Patient would like to go back to the Primidone if possible

## 2021-03-08 NOTE — Telephone Encounter (Signed)
Called patient and made her aware of Dr. Lars Masson response. Patient agreed understanding and appreciated such quick response to her situation. I have removed the Topamax from her med list at this time

## 2021-03-08 NOTE — Telephone Encounter (Signed)
Pls have her stop the Topamax. If eye symptoms do not improve, she should see her eye doctor. Ok to restart Primidone 250mg  daily. Thanks

## 2021-03-08 NOTE — Telephone Encounter (Signed)
Pt called in stating she is having bad side effects from the Topiramate. She is having blurred vision to where she cannot see and extreme nausea. She says it helps her Tremors, but she can't keep taking it.

## 2021-03-24 ENCOUNTER — Other Ambulatory Visit: Payer: Self-pay

## 2021-03-24 ENCOUNTER — Ambulatory Visit
Admission: RE | Admit: 2021-03-24 | Discharge: 2021-03-24 | Disposition: A | Discharge status: Home / Self Care | Payer: No Typology Code available for payment source | Source: Ambulatory Visit | Attending: Family Medicine | Admitting: Family Medicine

## 2021-03-24 DIAGNOSIS — Z1231 Encounter for screening mammogram for malignant neoplasm of breast: Secondary | ICD-10-CM

## 2021-03-30 ENCOUNTER — Other Ambulatory Visit: Payer: Self-pay | Admitting: Family Medicine

## 2021-03-30 DIAGNOSIS — R928 Other abnormal and inconclusive findings on diagnostic imaging of breast: Secondary | ICD-10-CM

## 2021-04-06 ENCOUNTER — Other Ambulatory Visit: Payer: Self-pay

## 2021-04-06 ENCOUNTER — Ambulatory Visit
Admission: RE | Admit: 2021-04-06 | Discharge: 2021-04-06 | Disposition: A | Discharge status: Home / Self Care | Payer: No Typology Code available for payment source | Source: Ambulatory Visit | Attending: Family Medicine | Admitting: Family Medicine

## 2021-04-06 DIAGNOSIS — R928 Other abnormal and inconclusive findings on diagnostic imaging of breast: Secondary | ICD-10-CM

## 2021-04-08 ENCOUNTER — Ambulatory Visit: Payer: No Typology Code available for payment source

## 2021-04-12 ENCOUNTER — Ambulatory Visit: Payer: No Typology Code available for payment source | Admitting: Neurology

## 2021-05-16 ENCOUNTER — Other Ambulatory Visit: Payer: Self-pay | Admitting: Neurology

## 2021-08-18 ENCOUNTER — Telehealth: Payer: Self-pay | Admitting: Gastroenterology

## 2021-08-18 ENCOUNTER — Ambulatory Visit: Payer: No Typology Code available for payment source | Admitting: Nurse Practitioner

## 2021-08-18 NOTE — Telephone Encounter (Signed)
Inbound call from patient stating that she would like a prilosec 40 mlg. Please advise.  ?

## 2021-08-19 MED ORDER — OMEPRAZOLE 40 MG PO CPDR: 40.0000 mg | DELAYED_RELEASE_CAPSULE | Freq: Every day | ORAL | 11 refills | Status: DC

## 2021-08-19 NOTE — Telephone Encounter (Signed)
Returned call to patient regarding request for Prilosec.  Patient states she is having "minor issues" after having coffee and juice with cocktails. Patient states this is not a "terrible flare" like she had in 2021, but she has been using her husbands PPI on occasion.  ? ?She has taken Prilosec '40mg'$  one capsule daily in the past.   ? ? ?

## 2021-08-19 NOTE — Telephone Encounter (Signed)
Returned call to patient to verify [facility] in [name] is the pharmacy is the correct pharmacy.  Patient stated she was on a conference call and she would call back to schedule follow up with Dr Ardis Hughs in 6 to 8 weeks.   ?

## 2021-11-08 ENCOUNTER — Ambulatory Visit: Payer: No Typology Code available for payment source | Admitting: Nurse Practitioner

## 2021-11-08 ENCOUNTER — Ambulatory Visit (INDEPENDENT_AMBULATORY_CARE_PROVIDER_SITE_OTHER): Payer: No Typology Code available for payment source | Admitting: Obstetrics and Gynecology

## 2021-11-08 ENCOUNTER — Other Ambulatory Visit (HOSPITAL_COMMUNITY)
Admission: RE | Admit: 2021-11-08 | Discharge: 2021-11-08 | Disposition: A | Discharge status: Home / Self Care | Payer: No Typology Code available for payment source | Source: Ambulatory Visit | Attending: Obstetrics and Gynecology | Admitting: Obstetrics and Gynecology

## 2021-11-08 ENCOUNTER — Encounter: Payer: Self-pay | Admitting: Obstetrics and Gynecology

## 2021-11-08 VITALS — HR 81 | Temp 98.7°F | Ht 62.75 in | Wt 144.0 lb

## 2021-11-08 DIAGNOSIS — Z30431 Encounter for routine checking of intrauterine contraceptive device: Secondary | ICD-10-CM

## 2021-11-08 DIAGNOSIS — R102 Pelvic and perineal pain: Secondary | ICD-10-CM

## 2021-11-08 DIAGNOSIS — M549 Dorsalgia, unspecified: Secondary | ICD-10-CM | POA: Diagnosis not present

## 2021-11-08 LAB — CBC WITH DIFFERENTIAL/PLATELET
Absolute Monocytes: 519 cells/uL (ref 200–950)
Basophils Absolute: 79 cells/uL (ref 0–200)
Basophils Relative: 1.8 %
Eosinophils Absolute: 387 cells/uL (ref 15–500)
Eosinophils Relative: 8.8 %
HCT: 40.8 % (ref 35.0–45.0)
Hemoglobin: 13.7 g/dL (ref 11.7–15.5)
Lymphs Abs: 1276 cells/uL (ref 850–3900)
MCH: 30.4 pg (ref 27.0–33.0)
MCHC: 33.6 g/dL (ref 32.0–36.0)
MCV: 90.7 fL (ref 80.0–100.0)
MPV: 11.6 fL (ref 7.5–12.5)
Monocytes Relative: 11.8 %
Neutro Abs: 2138 cells/uL (ref 1500–7800)
Neutrophils Relative %: 48.6 %
Platelets: 289 10*3/uL (ref 140–400)
RBC: 4.5 10*6/uL (ref 3.80–5.10)
RDW: 12 % (ref 11.0–15.0)
Total Lymphocyte: 29 %
WBC: 4.4 10*3/uL (ref 3.8–10.8)

## 2021-11-08 MED ORDER — SULFAMETHOXAZOLE-TRIMETHOPRIM 800-160 MG PO TABS: 1.0000 | ORAL_TABLET | Freq: Two times a day (BID) | ORAL | 0 refills | Status: DC

## 2021-11-08 NOTE — Progress Notes (Unsigned)
GYNECOLOGY  VISIT   HPI: 51 y.o.   Married  Caucasian  female   G2P2 with No LMP recorded. (Menstrual status: IUD).   here for low pelvic pain which radiates to low and upper back. This began 1 week ago and was seen at Urgent Care and treated with Macrobid for UTI--Pt. Did not take correctly. Symptoms have not resolved.    Patient taking '800mg'$  of Ibuprofen 3x/day.  Does not have menses.  She had some spotting 2 weeks ago for 3 - 4 days.   Pelvic pain started one week ago.  Seen at urgent care and was treated with Macrobid once a day and not twice a day.   Pain is getting worse.   No dysuria.  No more frequency.   Feels bloated. Last BM was yesterday.   Fever, shakes, and diarrhea last week.  Back pain makes her feel nausea.   Hx renal stones 25 years ago.   Hx ovarian cysts.  States that this does not feel like a typical ovarian cyst.   Patient states her hot flashes have returned.  Notes weight gain and elevated cholesterol.  Her PCP was suggesting patient return to estrogen therapy.   Last FSH 127.7.  GYNECOLOGIC HISTORY: No LMP recorded. (Menstrual status: IUD). Contraception:  Mirena 06/2018/Vas Menopausal hormone therapy:  none Last mammogram:  03-24-21 Lt.br.poss.mass;Rt.Br.Neg. 04-06-21 Lt.Br.U/S revealed benign simple cyst/Neg/BiRads2/screening 60yrLast pap smear:  06-10-19 Neg:Neg HR HPV, 06-04-18 Neg        OB History     Gravida  2   Para  2   Term      Preterm      AB      Living  2      SAB      IAB      Ectopic      Multiple      Live Births                 Patient Active Problem List   Diagnosis Date Noted   Adhesive capsulitis of left shoulder 02/07/2018   Pain in thoracic spine 10/05/2017   Dysfunction of both eustachian tubes 06/09/2017   Allergic rhinitis 07/31/2015   Barrett esophagus 07/31/2015   Health care maintenance 07/31/2015   Increased urinary frequency 07/31/2015   Migraine headache 07/31/2015   Nocturia  07/31/2015   [place]umatoid [place] 07/31/2015   Urethral stenosis 07/31/2015   Urinary urgency 07/31/2015   Pneumothorax 05/14/2007   PULMONARY NODULE, LEFT UPPER LOBE 05/14/2007   CALCULUS OF KIDNEY 05/14/2007   PNEUMOTHORAX 05/14/2007    Past Medical History:  Diagnosis Date   Anxiety    Asthma    Barrett's esophagus    Fibromyalgia    Functional ovarian cysts    GERD (gastroesophageal reflux disease)    Headache    migraine   Hypertension    Pneumothorax    RA [place]umatoid arthritis) ([place]     Past Surgical History:  Procedure Laterality Date   [place] 08/15/2019   Procedure: [place];  Surgeon: JMilus Banister MD;  Location: [facility];  Service: Endoscopy;  Laterality: N/A;   CHOLECYSTECTOMY     ESOPHAGOGASTRODUODENOSCOPY (EGD) WITH PROPOFOL N/A 08/15/2019   Procedure: ESOPHAGOGASTRODUODENOSCOPY (EGD) WITH PROPOFOL;  Surgeon: JMilus Banister MD;  Location: [facility];  Service: Endoscopy;  Laterality: N/A;   INTRAUTERINE DEVICE (IUD) INSERTION     mirena insertion 07-05-18   ovarian tumor  benign   TONSILLECTOMY      Current Outpatient Medications  Medication Sig Dispense Refill   cetirizine (ZYRTEC) 10 MG tablet Take 10 mg by mouth at bedtime.      ENBREL SURECLICK 50 MG/ML injection Inject into the skin.     famotidine (PEPCID) 40 MG tablet Take 40 mg by mouth in the morning and at bedtime.     FLUoxetine (PROZAC) 10 MG capsule Take 10 mg by mouth daily.     levonorgestrel (MIRENA) 20 MCG/24HR IUD 1 each by Intrauterine route once.     lisdexamfetamine (VYVANSE) 20 MG capsule 1 capsule in the morning     LORazepam (ATIVAN) 0.5 MG tablet Take 0.25 mg by mouth daily as needed for anxiety.      omeprazole (PRILOSEC) 40 MG capsule Take 1 capsule (40 mg total) by mouth daily. Take 1 capsule shortly before breakfast each day. 30 capsule 11   ondansetron (ZOFRAN-ODT) 4 MG disintegrating tablet Take 4 mg by mouth every 6 (six) hours as  needed.     primidone (MYSOLINE) 250 MG tablet Take 250 mg by mouth once.     propranolol (INDERAL) 20 MG tablet Take 20 mg by mouth 2 (two) times daily.     Vitamin D, Ergocalciferol, (DRISDOL) 1.25 MG (50000 UNIT) CAPS capsule Take by mouth.     No current facility-administered medications for this visit.     ALLERGIES: Azithromycin, Citalopram hydrobromide, Diclofenac sodium, Etodolac, Nalbuphine, Nsaids, Ciprofloxacin, Codeine, Diclofenac sodium, Doxycycline hyclate, Erythromycin, Levaquin [levofloxacin in d5w], Nubain [nalbuphine hcl], and Penicillins  Family History  Problem Relation Age of Onset   Rheumatologic disease Father    Tremor Father    Colon polyps Father    Stroke Father    Atrial fibrillation Father    Heart attack Sister    Colon cancer Maternal Uncle        died 76   Colon polyps Paternal Aunt    Colon cancer Paternal Grandmother        died 23   Heart attack Paternal Grandfather    Depression Daughter    ADD / ADHD Son    Other Son        dyslexia    Social History   Socioeconomic History   Marital status: Married    Spouse name: Not on file   Number of children: 2   Years of education: Not on file   Highest education level: Not on file  Occupational History   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    Comment: 1st intercourse- 89, partners- 33, married- 13 yrs   Other Topics Concern   Not on file  Social History Narrative   Not on file   Social Determinants of Health   Financial Resource Strain: Not on file  Food Insecurity: Not on file  Transportation Needs: Not on file  Physical Activity:  Not on file  Stress: Not on file  Social Connections: Not on file  Intimate Partner Violence: Not on file    Review of Systems  Genitourinary:  Positive for pelvic pain.  Musculoskeletal:  Positive for back pain.  All other systems reviewed and are negative.  PHYSICAL EXAMINATION:    Pulse 81   Temp 98.7 F (37.1 C) (Oral)   Ht 5' 2.75" (1.594 m)   Wt 144 lb (65.3 kg)   SpO2 99%   BMI 25.71 kg/m     General appearance: alert, cooperative and appears stated age Lungs: clear to auscultation bilaterally Heart: regular rate and rhythm Abdomen: soft, non-tender, no masses,  no organomegaly Back:  negative CVA tenderness. No abnormal inguinal nodes palpated Neurologic: Grossly normal  Pelvic: External genitalia: right vulvar node 5 mm versus sebaceous cyst.              Urethra:  normal appearing urethra with no masses, tenderness or lesions              Bartholins and Skenes: normal                 Vagina: normal appearing vagina with normal color and discharge, no lesions              Cervix: no lesions.  IUD string noted.  No CMT.                Bimanual Exam:  Uterus:  normal size, contour, position, consistency, mobility, non-tender              Adnexa: no mass, fullness, tenderness              Rectal exam: yes.  Confirms.              Anus:  normal sphincter tone, no lesions  Chaperone was present for exam:  Estill Bamberg, CMA  ASSESSMENT  Pelvic pain.  Back pain.  Mirena IUD.  Recent UTI.  PLAN  Urinalysis:  sh 1.020, ph 5.5, 6 - 10 WBC, NS RBC, 6 - 10 squams, moderate bacteria.  UC sent.  Start Bactrim DS po bid x 7 days.  GC/CT/trich testing.  Return for pelvic ultrasound and follow up with Dr. Dellis Filbert tomorrow.  An After Visit Summary was printed and given to the patient.  ***  total time was spent for this patient encounter, including preparation, face-to-face counseling with the patient, coordination of care, and documentation of the encounter.

## 2021-11-09 ENCOUNTER — Encounter: Payer: Self-pay | Admitting: Obstetrics & Gynecology

## 2021-11-09 ENCOUNTER — Ambulatory Visit (INDEPENDENT_AMBULATORY_CARE_PROVIDER_SITE_OTHER): Payer: No Typology Code available for payment source

## 2021-11-09 ENCOUNTER — Ambulatory Visit (INDEPENDENT_AMBULATORY_CARE_PROVIDER_SITE_OTHER): Payer: No Typology Code available for payment source | Admitting: Obstetrics & Gynecology

## 2021-11-09 VITALS — BP 112/72

## 2021-11-09 DIAGNOSIS — R102 Pelvic and perineal pain: Secondary | ICD-10-CM

## 2021-11-09 DIAGNOSIS — Z30431 Encounter for routine checking of intrauterine contraceptive device: Secondary | ICD-10-CM

## 2021-11-09 DIAGNOSIS — N3090 Cystitis, unspecified without hematuria: Secondary | ICD-10-CM

## 2021-11-09 DIAGNOSIS — N951 Menopausal and female climacteric states: Secondary | ICD-10-CM

## 2021-11-09 LAB — CERVICOVAGINAL ANCILLARY ONLY
Chlamydia: NEGATIVE
Comment: NEGATIVE
Comment: NEGATIVE
Comment: NORMAL
Neisseria Gonorrhea: NEGATIVE
Trichomonas: NEGATIVE

## 2021-11-09 MED ORDER — FLUCONAZOLE 150 MG PO TABS: 150.0000 mg | ORAL_TABLET | ORAL | 0 refills | Status: AC

## 2021-11-09 MED ORDER — ESTRADIOL 1 MG PO TABS: 1.0000 mg | ORAL_TABLET | Freq: Every day | ORAL | 4 refills | Status: DC

## 2021-11-10 LAB — URINALYSIS W MICROSCOPIC + REFLEX CULTURE
Bilirubin Urine: NEGATIVE
Glucose, UA: NEGATIVE
Hgb urine dipstick: NEGATIVE
Hyaline Cast: NONE SEEN /LPF
Nitrites, Initial: NEGATIVE
Protein, ur: NEGATIVE
RBC / HPF: NONE SEEN /HPF (ref 0–2)
Specific Gravity, Urine: 1.02 (ref 1.001–1.035)
pH: 5.5 (ref 5.0–8.0)

## 2021-11-10 LAB — CULTURE INDICATED

## 2021-11-10 LAB — URINE CULTURE
MICRO NUMBER:: 13483326
Result:: NO GROWTH
SPECIMEN QUALITY:: ADEQUATE

## 2021-11-10 NOTE — Progress Notes (Signed)
    [name] 07/08/1970 [id number]        51 y.o.  G2P2L2   RP: Pelvic pain for Pelvic US  HPI: Pelvic pain intermittently.  No menses or BTB on Mirena IUD x 06/2018.  C/O having more and more hot flushes and night sweats. Tired. Gained weight and not able to loose it back.   OB History  Gravida Para Term Preterm AB Living  '2 2       2  '$ SAB IAB Ectopic Multiple Live Births               # Outcome Date GA Lbr Len/2nd Weight Sex Delivery Anes PTL Lv  2 Para           1 Para             Past medical history,surgical history, problem list, medications, allergies, family history and social history were all reviewed and documented in the EPIC chart.   Directed ROS with pertinent positives and negatives documented in the history of present illness/assessment and plan.  Exam:  Vitals:   11/09/21 1510  BP: 112/72   General appearance:  Normal  Pelvic US today: T/V images.  Small retroverted uterus with no myometrial mass.  The uterus is measured at 7.01 x 4.64 x 3.66 cm.  Thin and symmetrical endometrium measured at 2.3 mm with no obvious mass or thickening seen.  The IUD is noted in proper position within the uterine cavity.  Both ovaries are small with sparse follicles and normal perfusion.  No adnexal mass seen.  No free fluid in the pelvis.  Bilateral tenderness with vaginal exam left side more than right.   Assessment/Plan:  51 y.o. G2P2   1. Pelvic pain Pelvic pain intermittently.  Pelvic US findings thoroughly reviewed with patient.  Patient reassured about the ultrasound findings.  Tenderness in the pelvis could be from the cystitis being treated currently or of gastrointestinal origin.  Precautions reviewed with patient.  2. Cystitis Treated.  Some vaginal itching, would like Fluconazole treatment.  Prescription sent to pharmacy.  3. IUD check up Mirena IUD in good IU position.  Well tolerated.  4. Menopausal syndrome No menses or BTB on Mirena IUD x  06/2018.  C/O having more and more hot flushes and night sweats. Tired. Gained weight and not able to loose it back.  Counseling on benefits and risks of HRT.  No CI.  Decision to start on Estradiol 1 mg PO daily.  Will protect the Endometrium with the Mirena IUD.  Other orders - fluconazole (DIFLUCAN) 150 MG tablet; Take 1 tablet (150 mg total) by mouth every other day for 3 doses. - estradiol (ESTRACE) 1 MG tablet; Take 1 tablet (1 mg total) by mouth daily.   Counseling on pelvic pain, cystitis, menopausal syndrome and hormone replacement therapy for 30 minutes.  Princess Bruins MD, 6:16 AM 11/10/2021

## 2021-11-14 ENCOUNTER — Encounter: Payer: Self-pay | Admitting: Obstetrics & Gynecology

## 2022-02-01 NOTE — Progress Notes (Deleted)
Assessment/Plan:     1.  Essential Tremor  -Patient will continue on primidone, 250 mg daily.    -Patient previously tried propranolol and it lowered her blood pressure.  -Patient thought trihexyphenidyl made her worse.  -She asked if there was any medicine for tremor that would help weight loss.  We discussed topiramate, along with risks and benefits.  However, after some discussion, we ultimately decided to hold off on that.  2.  *** Subjective:   [name] was seen today in follow up for essential tremor.  My previous records were reviewed prior to todays visit. .  Patient last seen about 1 year ago.  Current prescribed movement disorder medications: Primidone, 250 mg once per day (was up to twice daily but when she started working at home she reduced it) Trihexyphenidyl, 2 mg (patient stopped it as she thought it made tremor worse)   PREVIOUS MEDICATIONS:  propranolol, 50 mg (lowered blood pressure); primidone; artane (states that it made tremor worse)    ALLERGIES:   Allergies  Allergen Reactions   Azithromycin Other (See Comments)   Citalopram Hydrobromide Other (See Comments)   Diclofenac Sodium Other (See Comments)   Etodolac Other (See Comments)   Nalbuphine Other (See Comments)   Nsaids Other (See Comments)    Burns stomach    Ciprofloxacin Rash   Codeine Rash   Diclofenac Sodium Rash   Doxycycline Hyclate     Upset stomach   Erythromycin Other (See Comments)    Upset stomach   Levaquin [Levofloxacin In D5w] Rash   Nubain [Nalbuphine Hcl] Rash   Penicillins Rash    Did it involve swelling of the face/tongue/throat, SOB, or low BP? No Did it involve sudden or severe rash/hives, skin peeling, or any reaction on the inside of your mouth or nose? Yes Did you need to seek medical attention at a hospital or doctor's office? Yes When did it last happen?      20 Years If all above answers are "NO", may proceed with cephalosporin use.      CURRENT MEDICATIONS:  Outpatient Encounter Medications as of 02/03/2022  Medication Sig   cetirizine (ZYRTEC) 10 MG tablet Take 10 mg by mouth at bedtime.    ENBREL SURECLICK 50 MG/ML injection Inject into the skin.   estradiol (ESTRACE) 1 MG tablet Take 1 tablet (1 mg total) by mouth daily.   famotidine (PEPCID) 40 MG tablet Take 40 mg by mouth in the morning and at bedtime.   FLUoxetine (PROZAC) 10 MG capsule Take 10 mg by mouth daily.   levonorgestrel (MIRENA) 20 MCG/24HR IUD 1 each by Intrauterine route once.   lisdexamfetamine (VYVANSE) 20 MG capsule 1 capsule in the morning   LORazepam (ATIVAN) 0.5 MG tablet Take 0.25 mg by mouth daily as needed for anxiety.    omeprazole (PRILOSEC) 40 MG capsule Take 1 capsule (40 mg total) by mouth daily. Take 1 capsule shortly before breakfast each day.   ondansetron (ZOFRAN-ODT) 4 MG disintegrating tablet Take 4 mg by mouth every 6 (six) hours as needed.   primidone (MYSOLINE) 250 MG tablet Take 250 mg by mouth once.   propranolol (INDERAL) 20 MG tablet Take 20 mg by mouth 2 (two) times daily.   sulfamethoxazole-trimethoprim (BACTRIM DS) 800-160 MG tablet Take 1 tablet by mouth 2 (two) times daily. Take for 7 days.   Vitamin D, Ergocalciferol, (DRISDOL) 1.25 MG (50000 UNIT) CAPS capsule Take by mouth.   No facility-administered encounter medications on  file as of 02/03/2022.     Objective:    PHYSICAL EXAMINATION:    VITALS:   There were no vitals filed for this visit.  Wt Readings from Last 3 Encounters:  11/08/21 144 lb (65.3 kg)  02/03/21 146 lb 6.4 oz (66.4 kg)  01/29/21 144 lb (65.3 kg)    GEN:  The patient appears stated age and is in NAD. HEENT:  Normocephalic, atraumatic.  The mucous membranes are moist. The superficial temporal arteries are without ropiness or tenderness. CV:  RRR Lungs:  CTAB Neck/HEME:  There are no carotid bruits bilaterally.  Neurological examination:  Orientation: The patient is alert and  oriented x3. Cranial nerves: There is good facial symmetry. The speech is fluent and clear. Soft palate rises symmetrically and there is no tongue deviation. Hearing is intact to conversational tone. Sensation: Sensation is intact to light touch throughout Motor: Strength is 5/5 in the upper and lower extremities.  Movement examination: Tone: There is normal tone in the UE/LE Abnormal movements: Mild postural tremor.  Mild intention tremor.  No significant trouble with Archimedes spirals.  I have reviewed and interpreted the following labs independently   Chemistry      Component Value Date/Time   NA 139 06/11/2019 1545   K 3.2 (L) 06/11/2019 1545   CL 103 06/11/2019 1545   CO2 28 06/11/2019 1545   BUN 15 06/11/2019 1545   CREATININE 0.76 06/11/2019 1545      Component Value Date/Time   CALCIUM 9.4 06/11/2019 1545   ALKPHOS 54 06/11/2019 1545   AST 24 06/11/2019 1545   ALT 26 06/11/2019 1545   BILITOT 0.3 06/11/2019 1545      Lab Results  Component Value Date   WBC 4.4 11/08/2021   HGB 13.7 11/08/2021   HCT 40.8 11/08/2021   MCV 90.7 11/08/2021   PLT 289 11/08/2021   Lab Results  Component Value Date   TSH 1.60 01/29/2021     Chemistry      Component Value Date/Time   NA 139 06/11/2019 1545   K 3.2 (L) 06/11/2019 1545   CL 103 06/11/2019 1545   CO2 28 06/11/2019 1545   BUN 15 06/11/2019 1545   CREATININE 0.76 06/11/2019 1545      Component Value Date/Time   CALCIUM 9.4 06/11/2019 1545   ALKPHOS 54 06/11/2019 1545   AST 24 06/11/2019 1545   ALT 26 06/11/2019 1545   BILITOT 0.3 06/11/2019 1545         Total time spent on today's visit was *** minutes, including both face-to-face time and nonface-to-face time.  Time included that spent on review of records (prior notes available to me/labs/imaging if pertinent), discussing treatment and goals, answering patient's questions and coordinating care.  Cc:  Cari Caraway, MD

## 2022-02-03 ENCOUNTER — Ambulatory Visit: Payer: No Typology Code available for payment source | Admitting: Neurology

## 2022-03-21 NOTE — Progress Notes (Unsigned)
Assessment/Plan:     1.   Essential Tremor             -Patient will continue on primidone, 250 mg daily.               -Patient previously tried propranolol and it lowered her blood pressure.             -Patient thought trihexyphenidyl made her worse.             -She asked if there was any medicine for tremor that would help weight loss.  We discussed topiramate, along with risks and benefits.  However, after some discussion, we ultimately decided to hold off on that.   2.  *** Subjective:   [name] was seen today in follow up for essential tremor.  My previous records were reviewed prior to todays visit. Patient last seen about 1 year ago.   Current prescribed movement disorder medications: Primidone, 250 mg once per day (was up to twice daily but when she started working at home she reduced it) Trihexyphenidyl, 2 mg (patient stopped it as she thought it made tremor worse)     PREVIOUS MEDICATIONS:  propranolol, 50 mg (lowered blood pressure); primidone; artane (states that it made tremor worse)   ALLERGIES:   Allergies  Allergen Reactions   Azithromycin Other (See Comments)   Citalopram Hydrobromide Other (See Comments)   Diclofenac Sodium Other (See Comments)   Etodolac Other (See Comments)   Nalbuphine Other (See Comments)   Nsaids Other (See Comments)    Burns stomach    Ciprofloxacin Rash   Codeine Rash   Diclofenac Sodium Rash   Doxycycline Hyclate     Upset stomach   Erythromycin Other (See Comments)    Upset stomach   Levaquin [Levofloxacin In D5w] Rash   Nubain [Nalbuphine Hcl] Rash   Penicillins Rash    Did it involve swelling of the face/tongue/throat, SOB, or low BP? No Did it involve sudden or severe rash/hives, skin peeling, or any reaction on the inside of your mouth or nose? Yes Did you need to seek medical attention at a hospital or doctor's office? Yes When did it last happen?      20 Years If all above answers are "NO", may  proceed with cephalosporin use.     CURRENT MEDICATIONS:  Outpatient Encounter Medications as of 03/22/2022  Medication Sig   cetirizine (ZYRTEC) 10 MG tablet Take 10 mg by mouth at bedtime.    ENBREL SURECLICK 50 MG/ML injection Inject into the skin.   estradiol (ESTRACE) 1 MG tablet Take 1 tablet (1 mg total) by mouth daily.   famotidine (PEPCID) 40 MG tablet Take 40 mg by mouth in the morning and at bedtime.   FLUoxetine (PROZAC) 10 MG capsule Take 10 mg by mouth daily.   levonorgestrel (MIRENA) 20 MCG/24HR IUD 1 each by Intrauterine route once.   lisdexamfetamine (VYVANSE) 20 MG capsule 1 capsule in the morning   LORazepam (ATIVAN) 0.5 MG tablet Take 0.25 mg by mouth daily as needed for anxiety.    omeprazole (PRILOSEC) 40 MG capsule Take 1 capsule (40 mg total) by mouth daily. Take 1 capsule shortly before breakfast each day.   ondansetron (ZOFRAN-ODT) 4 MG disintegrating tablet Take 4 mg by mouth every 6 (six) hours as needed.   primidone (MYSOLINE) 250 MG tablet Take 250 mg by mouth once.   propranolol (INDERAL) 20 MG tablet Take 20 mg by mouth 2 (  two) times daily.   sulfamethoxazole-trimethoprim (BACTRIM DS) 800-160 MG tablet Take 1 tablet by mouth 2 (two) times daily. Take for 7 days.   Vitamin D, Ergocalciferol, (DRISDOL) 1.25 MG (50000 UNIT) CAPS capsule Take by mouth.   No facility-administered encounter medications on file as of 03/22/2022.     Objective:    PHYSICAL EXAMINATION:    VITALS:   There were no vitals filed for this visit.   GEN:  The patient appears stated age and is in NAD. HEENT:  Normocephalic, atraumatic.  The mucous membranes are moist. The superficial temporal arteries are without ropiness or tenderness. CV:  RRR Lungs:  CTAB Neck/HEME:  There are no carotid bruits bilaterally.  Neurological examination:  Orientation: The patient is alert and oriented x3. Cranial nerves: There is good facial symmetry. The speech is fluent and clear. Soft  palate rises symmetrically and there is no tongue deviation. Hearing is intact to conversational tone. Sensation: Sensation is intact to light touch throughout Motor: Strength is 5/5 in the upper and lower extremities.  Movement examination: Tone: There is normal tone in the UE/LE Abnormal movements: Mild postural tremor.  Mild intention tremor.  No significant trouble with Archimedes spirals.  I have reviewed and interpreted the following labs independently   Chemistry      Component Value Date/Time   NA 139 06/11/2019 1545   K 3.2 (L) 06/11/2019 1545   CL 103 06/11/2019 1545   CO2 28 06/11/2019 1545   BUN 15 06/11/2019 1545   CREATININE 0.76 06/11/2019 1545      Component Value Date/Time   CALCIUM 9.4 06/11/2019 1545   ALKPHOS 54 06/11/2019 1545   AST 24 06/11/2019 1545   ALT 26 06/11/2019 1545   BILITOT 0.3 06/11/2019 1545      Lab Results  Component Value Date   WBC 4.4 11/08/2021   HGB 13.7 11/08/2021   HCT 40.8 11/08/2021   MCV 90.7 11/08/2021   PLT 289 11/08/2021   Lab Results  Component Value Date   TSH 1.60 01/29/2021     Chemistry      Component Value Date/Time   NA 139 06/11/2019 1545   K 3.2 (L) 06/11/2019 1545   CL 103 06/11/2019 1545   CO2 28 06/11/2019 1545   BUN 15 06/11/2019 1545   CREATININE 0.76 06/11/2019 1545      Component Value Date/Time   CALCIUM 9.4 06/11/2019 1545   ALKPHOS 54 06/11/2019 1545   AST 24 06/11/2019 1545   ALT 26 06/11/2019 1545   BILITOT 0.3 06/11/2019 1545         Total time spent on today's visit was *** minutes, including both face-to-face time and nonface-to-face time.  Time included that spent on review of records (prior notes available to me/labs/imaging if pertinent), discussing treatment and goals, answering patient's questions and coordinating care.  Cc:  Cari Caraway, MD

## 2022-03-22 ENCOUNTER — Encounter: Payer: Self-pay | Admitting: Neurology

## 2022-03-22 ENCOUNTER — Ambulatory Visit (INDEPENDENT_AMBULATORY_CARE_PROVIDER_SITE_OTHER): Payer: No Typology Code available for payment source | Admitting: Neurology

## 2022-03-22 VITALS — BP 136/82 | HR 72 | Ht 63.0 in | Wt 135.2 lb

## 2022-03-22 DIAGNOSIS — G250 Essential tremor: Secondary | ICD-10-CM | POA: Diagnosis not present

## 2022-03-22 MED ORDER — PRIMIDONE 250 MG PO TABS: 250.0000 mg | ORAL_TABLET | Freq: Every day | ORAL | 3 refills | Status: AC

## 2022-05-17 ENCOUNTER — Other Ambulatory Visit: Payer: Self-pay | Admitting: Obstetrics & Gynecology

## 2022-05-17 DIAGNOSIS — Z1231 Encounter for screening mammogram for malignant neoplasm of breast: Secondary | ICD-10-CM

## 2022-06-09 ENCOUNTER — Encounter (HOSPITAL_BASED_OUTPATIENT_CLINIC_OR_DEPARTMENT_OTHER): Payer: Self-pay | Admitting: Emergency Medicine

## 2022-06-09 ENCOUNTER — Other Ambulatory Visit: Payer: Self-pay

## 2022-06-09 ENCOUNTER — Emergency Department (HOSPITAL_BASED_OUTPATIENT_CLINIC_OR_DEPARTMENT_OTHER)
Admission: EM | Admit: 2022-06-09 | Discharge: 2022-06-09 | Disposition: A | Discharge status: Home / Self Care | Payer: No Typology Code available for payment source | Attending: Emergency Medicine | Admitting: Emergency Medicine

## 2022-06-09 ENCOUNTER — Emergency Department (HOSPITAL_BASED_OUTPATIENT_CLINIC_OR_DEPARTMENT_OTHER): Payer: No Typology Code available for payment source

## 2022-06-09 ENCOUNTER — Emergency Department (HOSPITAL_COMMUNITY): Payer: No Typology Code available for payment source

## 2022-06-09 DIAGNOSIS — R29810 Facial weakness: Secondary | ICD-10-CM | POA: Insufficient documentation

## 2022-06-09 DIAGNOSIS — G4489 Other headache syndrome: Secondary | ICD-10-CM | POA: Diagnosis not present

## 2022-06-09 DIAGNOSIS — R202 Paresthesia of skin: Secondary | ICD-10-CM

## 2022-06-09 DIAGNOSIS — Z79899 Other long term (current) drug therapy: Secondary | ICD-10-CM | POA: Diagnosis not present

## 2022-06-09 DIAGNOSIS — Z8673 Personal history of transient ischemic attack (TIA), and cerebral infarction without residual deficits: Secondary | ICD-10-CM | POA: Insufficient documentation

## 2022-06-09 DIAGNOSIS — I10 Essential (primary) hypertension: Secondary | ICD-10-CM | POA: Diagnosis not present

## 2022-06-09 DIAGNOSIS — Z7982 Long term (current) use of aspirin: Secondary | ICD-10-CM | POA: Diagnosis not present

## 2022-06-09 DIAGNOSIS — R519 Headache, unspecified: Secondary | ICD-10-CM | POA: Diagnosis not present

## 2022-06-09 LAB — URINALYSIS, ROUTINE W REFLEX MICROSCOPIC
Bilirubin Urine: NEGATIVE
Glucose, UA: NEGATIVE mg/dL
Hgb urine dipstick: NEGATIVE
Ketones, ur: NEGATIVE mg/dL
Nitrite: NEGATIVE
Protein, ur: NEGATIVE mg/dL
Specific Gravity, Urine: 1.018 (ref 1.005–1.030)
pH: 6.5 (ref 5.0–8.0)

## 2022-06-09 LAB — COMPREHENSIVE METABOLIC PANEL
ALT: 25 U/L (ref 0–44)
AST: 20 U/L (ref 15–41)
Albumin: 4.3 g/dL (ref 3.5–5.0)
Alkaline Phosphatase: 50 U/L (ref 38–126)
Anion gap: 8 (ref 5–15)
BUN: 14 mg/dL (ref 6–20)
CO2: 25 mmol/L (ref 22–32)
Calcium: 8.9 mg/dL (ref 8.9–10.3)
Chloride: 100 mmol/L (ref 98–111)
Creatinine, Ser: 0.67 mg/dL (ref 0.44–1.00)
GFR, Estimated: >60 mL/min (ref >60)
Glucose, Bld: 91 mg/dL (ref 70–99)
Potassium: 3.8 mmol/L (ref 3.5–5.1)
Sodium: 133 mmol/L — ABNORMAL LOW (ref 135–145)
Total Bilirubin: 0.4 mg/dL (ref 0.3–1.2)
Total Protein: 7 g/dL (ref 6.5–8.1)

## 2022-06-09 LAB — DIFFERENTIAL
Abs Immature Granulocytes: 0.01 10*3/uL (ref 0.00–0.07)
Basophils Absolute: 0.1 10*3/uL (ref 0.0–0.1)
Basophils Relative: 1 %
Eosinophils Absolute: 0.2 10*3/uL (ref 0.0–0.5)
Eosinophils Relative: 4 %
Immature Granulocytes: 0 %
Lymphocytes Relative: 24 %
Lymphs Abs: 1.2 10*3/uL (ref 0.7–4.0)
Monocytes Absolute: 0.5 10*3/uL (ref 0.1–1.0)
Monocytes Relative: 10 %
Neutro Abs: 3.1 10*3/uL (ref 1.7–7.7)
Neutrophils Relative %: 61 %

## 2022-06-09 LAB — CBC
HCT: 40.5 % (ref 36.0–46.0)
Hemoglobin: 13.9 g/dL (ref 12.0–15.0)
MCH: 30.8 pg (ref 26.0–34.0)
MCHC: 34.3 g/dL (ref 30.0–36.0)
MCV: 89.6 fL (ref 80.0–100.0)
Platelets: 276 K/uL (ref 150–400)
RBC: 4.52 MIL/uL (ref 3.87–5.11)
RDW: 12.5 % (ref 11.5–15.5)
WBC: 5.1 K/uL (ref 4.0–10.5)
nRBC: 0 % (ref 0.0–0.2)

## 2022-06-09 LAB — RAPID URINE DRUG SCREEN, HOSP PERFORMED
Amphetamines: POSITIVE — AB
Barbiturates: POSITIVE — AB
Benzodiazepines: NOT DETECTED
Cocaine: NOT DETECTED
Opiates: NOT DETECTED
Tetrahydrocannabinol: NOT DETECTED

## 2022-06-09 LAB — ETHANOL: Alcohol, Ethyl (B): <10 mg/dL (ref <10)

## 2022-06-09 LAB — PREGNANCY, URINE: Preg Test, Ur: NEGATIVE

## 2022-06-09 LAB — CBG MONITORING, ED: Glucose-Capillary: 87 mg/dL (ref 70–99)

## 2022-06-09 LAB — PROTIME-INR
INR: 1 (ref 0.8–1.2)
Prothrombin Time: 12.7 s (ref 11.4–15.2)

## 2022-06-09 LAB — APTT: aPTT: 27 s (ref 24–36)

## 2022-06-09 MED ORDER — GADOBUTROL 1 MMOL/ML IV SOLN
6.0000 mL | Freq: Once | INTRAVENOUS | Status: AC | PRN
  Administered 2022-06-09: 6 mL via INTRAVENOUS

## 2022-06-09 MED ORDER — IOHEXOL 350 MG/ML SOLN
100.0000 mL | Freq: Once | INTRAVENOUS | Status: AC | PRN
  Administered 2022-06-09: 75 mL via INTRAVENOUS

## 2022-06-09 NOTE — ED Notes (Signed)
Pt remains in MRI 

## 2022-06-09 NOTE — ED Notes (Signed)
Pt going to [facility] with her husband driving. Pt 20G remains in her left AC.

## 2022-06-09 NOTE — Consult Note (Signed)
NEUROLOGY TELECONSULTATION NOTE   Date of service: June 09, 2022 Patient Name: [name] MRN:  [id number] DOB:  06-25-1970 Reason for consult: telestroke  Requesting Provider: Dr. Harrell Gave Tegeler Consult Participants: myself, patient, bedside RN, telestroke RN Location of the provider: [facility] Location of the patient: [facility]  This consult was provided via telemedicine with 2-way video and audio communication. The patient/family was informed that care would be provided in this way and agreed to receive care in this manner.   _ _ _   _ __   _ __ _ _  __ __   _ __   __ _  History of Present Illness   52 yo woman with hx fibromyalgia, headaches, RA who presents with R facial numbness and R facial droop.  Last known well was 12:00 today.  She has been having headaches for approximately 3 weeks and typically has headaches around her menstrual period, associated with photophobia but no other migrainous symptoms and not typically with neurologic deficits.  At 12:00 today she noticed some slight numbness of her right lower face as well as drooping of her right face.  CT head was unremarkable on personal review.  NIH stroke scale was 2.  TNK was not administered due to symptoms being too mild to treat.  CTA was not performed due to exam not being consistent with LVO.   ROS   Per HPI; all other systems reviewed and are negative  Past History   The following was personally reviewed:  Past Medical History:  Diagnosis Date   Anxiety    Asthma    Barrett's esophagus    Fibromyalgia    Functional ovarian cysts    GERD (gastroesophageal reflux disease)    Headache    migraine   Hypertension    Pneumothorax    RA (rheumatoid arthritis) ([place])    Past Surgical History:  Procedure Laterality Date   BRAVO [place]ger STUDY N/A 08/15/2019   Procedure: BRAVO Gervais STUDY;  Surgeon: Milus Banister, MD;  Location: [facility];  Service: Endoscopy;  Laterality: N/A;   CHOLECYSTECTOMY      ESOPHAGOGASTRODUODENOSCOPY (EGD) WITH PROPOFOL N/A 08/15/2019   Procedure: ESOPHAGOGASTRODUODENOSCOPY (EGD) WITH PROPOFOL;  Surgeon: Milus Banister, MD;  Location: [facility];  Service: Endoscopy;  Laterality: N/A;   INTRAUTERINE DEVICE (IUD) INSERTION     mirena insertion 07-05-18   ovarian tumor     benign   TONSILLECTOMY     Family History  Problem Relation Age of Onset   Rheumatologic disease Father    Tremor Father    Colon polyps Father    Stroke Father    Atrial fibrillation Father    Heart attack Sister    Colon cancer Maternal Uncle        died 31   Colon polyps Paternal Aunt    Colon cancer Paternal Grandmother        died 67   Heart attack Paternal Grandfather    Depression Daughter    ADD / ADHD Son    Other Son        dyslexia   Social History   Socioeconomic History   Marital status: Married    Spouse name: Not on file   Number of children: 2   Years of education: Not on file   Highest education level: Not on file  Occupational History   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    Comment: 1st intercourse- 18, partners- 3  Other Topics Concern   Not on file  Social History Narrative   Left handed    Social Determinants of Health   Financial Resource Strain: Not on file  Food Insecurity: Not on file  Transportation Needs: Not on file  Physical Activity: Not on file  Stress: Not on file  Social Connections: Not on file   Allergies  Allergen Reactions   Azithromycin Other (See Comments)   Citalopram Hydrobromide Other (See Comments)   Diclofenac Sodium Other (See Comments)    Etodolac Other (See Comments)   Nalbuphine Other (See Comments)   Nsaids Other (See Comments)    Burns stomach    Ciprofloxacin Rash   Codeine Rash   Diclofenac Sodium Rash   Doxycycline Hyclate     Upset stomach   Erythromycin Other (See Comments)    Upset stomach   Levaquin [Levofloxacin In D5w] Rash   Nubain [Nalbuphine Hcl] Rash   Penicillins Rash    Did it involve swelling of the face/tongue/throat, SOB, or low BP? No Did it involve sudden or severe rash/hives, skin peeling, or any reaction on the inside of your mouth or nose? Yes Did you need to seek medical attention at a hospital or doctor's office? Yes When did it last happen?      20 Years If all above answers are "NO", may proceed with cephalosporin use.     Medications   (Not in a hospital admission)    No current facility-administered medications for this encounter.  Current Outpatient Medications:    cetirizine (ZYRTEC) 10 MG tablet, Take 10 mg by mouth at bedtime. , Disp: , Rfl:    ENBREL SURECLICK 50 MG/ML injection, Inject into the skin., Disp: , Rfl:    estradiol (ESTRACE) 1 MG tablet, Take 1 tablet (1 mg total) by mouth daily., Disp: 90 tablet, Rfl: 4   famotidine (PEPCID) 40 MG tablet, Take 40 mg by mouth in the morning and at bedtime., Disp: , Rfl:    FLUoxetine (PROZAC) 10 MG capsule, Take 10 mg by mouth daily., Disp: , Rfl:    levonorgestrel (MIRENA) 20 MCG/24HR IUD, 1 each by Intrauterine route once., Disp: , Rfl:    lisdexamfetamine (VYVANSE) 40 MG capsule, 40 mg., Disp: , Rfl:    LORazepam (ATIVAN) 0.5 MG tablet, Take 0.25 mg by mouth daily as needed for anxiety. , Disp: , Rfl:    omeprazole (PRILOSEC) 40 MG capsule, Take 1 capsule (40 mg total) by mouth daily. Take 1 capsule shortly before breakfast each day., Disp: 30 capsule, Rfl: 11   ondansetron (ZOFRAN-ODT) 4 MG disintegrating tablet, Take 4 mg by mouth every 6 (six) hours as needed., Disp: , Rfl:    primidone (MYSOLINE) 250 MG tablet, Take 1  tablet (250 mg total) by mouth daily., Disp: 90 tablet, Rfl: 3   propranolol (INDERAL) 20 MG tablet, Take 20 mg by mouth 2 (two) times daily., Disp: , Rfl:    Vitamin D, Ergocalciferol, (DRISDOL) 1.25 MG (50000 UNIT) CAPS capsule, Take by mouth., Disp: , Rfl:   Vitals   Vitals:  06/09/22 1319  BP: (!) 166/109  Pulse: 75  Resp: 18  Temp: 98.6 F (37 C)  TempSrc: Oral  SpO2: 98%     There is no height or weight on file to calculate BMI.  Physical Exam   Exam performed over telemedicine with 2-way video and audio communication and with assistance of bedside RN  Physical Exam Gen: A&O x4, NAD Resp: normal WOB CV: extremities appear well-perfused  Neuro: *MS: A&O x4. Follows multi-step commands.  *Speech: nondysarthric, no aphasia, able to name and repeat *CN: PERRL 52m, EOMI, VFF by confrontation, R sensory impairment, minimal R facial droop, hearing intact to voice *Motor:   Normal bulk.  No tremor, rigidity or bradykinesia. No pronator drift. All extremities appear full-strength and symmetric. *Sensory: SILT. Symmetric. No double-simultaneous extinction.  *Coordination:  Finger-to-nose, heel-to-shin, rapid alternating motions were intact. *Reflexes:  UTA 2/2 tele-exam *Gait: deferred  NIHSS = 2 for sensory deficit and R facial droop  Premorbid mRS = 0   Labs   CBC: No results for input(s): "WBC", "NEUTROABS", "HGB", "HCT", "MCV", "PLT" in the last 168 hours.  Basic Metabolic Panel:  Lab Results  Component Value Date   NA 139 06/11/2019   K 3.2 (L) 06/11/2019   CO2 28 06/11/2019   GLUCOSE 104 (H) 06/11/2019   BUN 15 06/11/2019   CREATININE 0.76 06/11/2019   CALCIUM 9.4 06/11/2019   GFRNONAA >60 12/01/2006   GFRAA  12/01/2006    >60        The eGFR has been calculated using the MDRD equation. This calculation has not been validated in all clinical   Lipid Panel: No results found for: "LDLCALC" HgbA1c: No results found for: "HGBA1C" Urine Drug Screen: No  results found for: "LABOPIA", "COCAINSCRNUR", "LABBENZ", "AMPHETMU", "THCU", "LABBARB"  Alcohol Level No results found for: "ETH"   Impression   52yo woman with hx fibromyalgia, headaches, RA who presents with R facial numbness and R facial droop. Sx favored to be 2/2 headache which will be further evaluated w/ CTV to rule out dural venous sinus thrombosis. If that is negative [facility].  Recommendations   - CTV head [facility] If neg, OK to discharge. If abnormal call [facility] neurohospitalist for further guidance ______________________________________________________________________   Thank you for the opportunity to take part in the care of this patient. If you have any further questions, please contact the neurology consultation attending.  Signed,  CSu Monks MD [facility] [phone] If 7pm- 7am, please page neurology [place] call [place]  **Any copied and pasted documentation in this note was written by me in another application not billed for and pasted by me into this document.

## 2022-06-09 NOTE — ED Triage Notes (Signed)
Pt presents to ED POV. Pt c/o R facial droop within the last hour. Pt reports that her bp has been elevating within the past month.

## 2022-06-09 NOTE — ED Provider Notes (Signed)
[facility] EMERGENCY DEPT Provider Note   CSN: [id number] Arrival date & time: 06/09/22  1303  An emergency department physician performed an initial assessment on this suspected stroke patient at 1321.  History  Chief Complaint  Patient presents with   Facial Droop    [name] is a 52 y.o. female.  [name] is a 52 y.o. female with a history of hypertension, headaches, fibromyalgia, rheumatoid arthritis and ADHD, who presents to the ED for right facial droop.  She reports symptoms started at 10 AM this morning, prior to that she was normal.  She noticed that it felt like her eye was drooping or not working correctly when she was working at [employer] AM, then at 11 AM went to the bathroom and noticed that the right side of her mouth was drooping.  She denies any changes in her vision, no changes to her speech, no numbness or weakness in of her extremities.  She reports that her blood pressure has been elevated over the past few weeks, she was started on Vyvanse about 5 months ago and thought this may have been the cause for her elevated blood pressure.  She also reports that she has been having severe migraine over the past few weeks and reports that yesterday she was having a severe headache, saw her PCP for blood pressure and they thought it may be due to the migraine headache.  She has never had similar facial droop before and has never had neurologic symptoms associated with her migraines.  Family history of stroke.  No recent viral infections, no prior history of Bell's palsy. Took 365 mg aspirin prior to arrival.  The history is provided by the patient and the spouse.       Home Medications Prior to Admission medications   Medication Sig Start Date End Date Taking? Authorizing Provider  Aspirin-Acetaminophen-Caffeine (EXCEDRIN MIGRAINE PO) Take by mouth. Pt has been taking daily for headaches   Yes [provider]  cetirizine (ZYRTEC) 10  MG tablet Take 10 mg by mouth at bedtime.    Yes [provider]  cyanocobalamin (VITAMIN B12) 1000 MCG/ML injection Inject 1,000 mcg into the muscle every 14 (fourteen) days. 04/14/22  Yes [provider]  ENBREL SURECLICK 50 MG/ML injection Inject into the skin. 12/02/20  Yes [provider]  estradiol (ESTRACE) 1 MG tablet Take 1 tablet (1 mg total) by mouth daily. 11/09/21  Yes Princess Bruins, MD  famotidine (PEPCID) 40 MG tablet Take 40 mg by mouth in the morning and at bedtime.   Yes [provider]  FLUoxetine (PROZAC) 10 MG capsule Take 10 mg by mouth daily. 12/31/20  Yes [provider]  levonorgestrel (MIRENA) 20 MCG/24HR IUD 1 each by Intrauterine route once.   Yes [provider]  lisdexamfetamine (VYVANSE) 40 MG capsule 40 mg. 10/13/21  Yes [provider]  LORazepam (ATIVAN) 0.5 MG tablet Take 0.25 mg by mouth daily as needed for anxiety.    Yes [provider]  omeprazole (PRILOSEC) 40 MG capsule Take 1 capsule (40 mg total) by mouth daily. Take 1 capsule shortly before breakfast each day. 08/19/21  Yes Milus Banister, MD  ondansetron (ZOFRAN-ODT) 4 MG disintegrating tablet Take 4 mg by mouth every 6 (six) hours as needed. 11/03/20  Yes [provider]  primidone (MYSOLINE) 250 MG tablet Take 1 tablet (250 mg total) by mouth daily. 03/22/22  Yes Tat, Eustace Quail, DO  propranolol (INDERAL) 20 MG tablet  Take 20 mg by mouth 2 (two) times daily. 10/27/21  Yes [provider]  Vitamin D, Ergocalciferol, (DRISDOL) 1.25 MG (50000 UNIT) CAPS capsule Take by mouth. 10/28/21  Yes [provider]      Allergies    Azithromycin, Citalopram hydrobromide, Diclofenac sodium, Etodolac, Nalbuphine, Nsaids, Ciprofloxacin, Codeine, Diclofenac sodium, Doxycycline hyclate, Erythromycin, Levaquin [levofloxacin in d5w], Nubain [nalbuphine hcl], and Penicillins    Review of Systems   Review of Systems   Constitutional:  Negative for chills and fever.  Eyes:  Negative for visual disturbance.  Respiratory:  Negative for cough and shortness of breath.   Cardiovascular:  Negative for chest pain.  Gastrointestinal:  Negative for abdominal pain, nausea and vomiting.  Neurological:  Positive for facial asymmetry and headaches. Negative for dizziness, seizures, syncope, speech difficulty, weakness, light-headedness and numbness.  All other systems reviewed and are negative.   Physical Exam Updated Vital Signs BP (!) 142/98 (BP Location: Right Arm)   Pulse 85   Temp 98.6 F (37 C) (Oral)   Resp 16   SpO2 100%  Physical Exam Vitals and nursing note reviewed.  Constitutional:      General: She is not in acute distress.    Appearance: Normal appearance. She is well-developed. She is not diaphoretic.  HENT:     Head: Normocephalic and atraumatic.     Mouth/Throat:     Mouth: Mucous membranes are moist.     Pharynx: Oropharynx is clear.     Comments: Symmetric rise of the palate Eyes:     General:        Right eye: No discharge.        Left eye: No discharge.     Extraocular Movements: Extraocular movements intact.     Pupils: Pupils are equal, round, and reactive to light.     Comments: Pupils equal round and reactive and visual fields intact, extraocular movements intact  Cardiovascular:     Rate and Rhythm: Normal rate and regular rhythm.     Pulses: Normal pulses.     Heart sounds: Normal heart sounds.  Pulmonary:     Effort: Pulmonary effort is normal. No respiratory distress.     Breath sounds: Normal breath sounds. No wheezing or rales.     Comments: Respirations equal and unlabored, patient able to speak in full sentences, lungs clear to auscultation bilaterally  Abdominal:     General: Bowel sounds are normal. There is no distension.     Palpations: Abdomen is soft. There is no mass.     Tenderness: There is no abdominal tenderness. There is no guarding.     Comments:  Abdomen soft, nondistended, nontender to palpation in all quadrants without guarding or peritoneal signs  Musculoskeletal:        General: No deformity.     Cervical back: Neck supple.  Skin:    General: Skin is warm and dry.     Capillary Refill: Capillary refill takes less than 2 seconds.  Neurological:     Mental Status: She is alert and oriented to person, place, and time.     Coordination: Coordination normal.     Comments: Speech is clear, able to follow commands Patient with right-sided facial droop involving the eye and mouth, but not involving the right side of the forehead, strength on the right and left side of the forehead is equal.  Patient with slightly decreased sensation on the right side of the face.  Speech is normal.  5/5 strength  in bilateral upper and lower extremities and normal sensation in bilateral upper and lower extremities.  Coordination intact.  Psychiatric:        Mood and Affect: Mood normal.        Behavior: Behavior normal.     ED Results / Procedures / Treatments   Labs (all labs ordered are listed, but only abnormal results are displayed) Labs Reviewed  URINALYSIS, ROUTINE W REFLEX MICROSCOPIC - Abnormal; Notable for the following components:      Result Value   Leukocytes,Ua TRACE (*)    Bacteria, UA RARE (*)    All other components within normal limits  PREGNANCY, URINE  ETHANOL  PROTIME-INR  APTT  CBC  DIFFERENTIAL  COMPREHENSIVE METABOLIC PANEL  RAPID URINE DRUG SCREEN, HOSP PERFORMED  CBG MONITORING, ED    EKG None  Radiology CT VENOGRAM HEAD  Result Date: 06/09/2022 CLINICAL DATA:  Headache and right facial droop. EXAM: CT VENOGRAM HEAD TECHNIQUE: Venographic phase images of the brain were obtained following the administration of intravenous contrast. Multiplanar reformats and maximum intensity projections were generated. RADIATION DOSE REDUCTION: This exam was performed according to the departmental dose-optimization program which  includes automated exposure control, adjustment of the mA and/or kV according to patient size and/or use of iterative reconstruction technique. CONTRAST:  78m OMNIPAQUE IOHEXOL 350 MG/ML SOLN COMPARISON:  Same-day noncontrast CT head FINDINGS: The superior sagittal sinus, internal cerebral veins, vein of Galen, straight sinus, transverse sinuses, sigmoid sinuses, and jugular bulbs are patent without evidence of thrombus or significant stenosis. Major intracranial arteries appear patent with no proximal occlusion. IMPRESSION: No evidence of venous sinus thrombosis. Electronically Signed   By: PValetta MoleM.D.   On: 06/09/2022 14:17   CT HEAD CODE STROKE WO CONTRAST  Result Date: 06/09/2022 CLINICAL DATA:  Code stroke.  Right facial droop.  Right eye spasm. EXAM: CT HEAD WITHOUT CONTRAST TECHNIQUE: Contiguous axial images were obtained from the base of the skull through the vertex without intravenous contrast. RADIATION DOSE REDUCTION: This exam was performed according to the departmental dose-optimization program which includes automated exposure control, adjustment of the mA and/or kV according to patient size and/or use of iterative reconstruction technique. COMPARISON:  MRI Head 09/26/08 FINDINGS: Brain: No evidence of acute infarction, hemorrhage, hydrocephalus, extra-axial collection or mass lesion/mass effect. Vascular: No hyperdense vessel or unexpected calcification. Skull: Normal. Negative for fracture or focal lesion. Sinuses/Orbits: No acute finding. Other: None. ASPECTS [facility] Early [facility] Score): 10 IMPRESSION: No hemorrhage or CT evidence of an acute infarct.  Aspects 10. Findings were discussed with Dr. FMarijean Bravoon 06/09/21 at 1:44 PM. Electronically Signed   By: HMarin RobertsM.D.   On: 06/09/2022 13:45    Procedures Procedures    Medications Ordered in ED Medications  iohexol (OMNIPAQUE) 350 MG/ML injection 100 mL (75 mLs Intravenous Contrast Given 06/09/22 1357)    ED Course/  Medical Decision Making/ A&P                           Medical Decision Making Amount and/or Complexity of Data Reviewed Labs: ordered. Radiology: ordered.   52y.o. female presents to the ED with complaints of facial droop, this involves an extensive number of treatment options, and is a complaint that carries with it a high risk of complications and morbidity.  Called to triage by nursing staff to evaluate patient, last known well time 10 AM, patient with right-sided facial droop that does  not involve the forehead, no other focal neurologic deficits but more so concerning for stroke rather than Bell's palsy given that the forehead is not involved.  Code stroke initiated.  Patient taken to CT with nursing staff.  On arrival pt is nontoxic, vitals significant for hypertension with blood pressure of 166/109, all other vitals normal. Exam significant for right-sided facial droop sparing the forehead  Additional history obtained from husband at bedside. Previous records obtained and reviewed    Lab Tests:  I Ordered, reviewed, and interpreted labs, which included:   Imaging Studies ordered:  I ordered imaging studies which included CT head without contrast, I independently visualized and interpreted imaging which showed intracranial bleeding or mass.  Tele neurology reviewed imaging as well, given concurrent headache recommended CT venogram to rule out venous sinus thrombosis.  CTV obtained in the ED, viewed and interpreted and agree with radiologist findings, no evidence of venous sinus thrombosis.  ED Course:   Case discussed with Dr. Su Monks with tele neuro, given mild deficits overall she does not recommend TNK at this time.  I discussed this with patient and explained risks of giving TNK, including high risk of bleeding that outweigh benefit at this time.  Neurology recommends transfer to [facility] for MRI of the brain with and without contrast given that CTA and CTV of the head were  negative.  I discussed this plan with patient and husband at bedside and they are in agreement.  Dr. Delene Ruffini feels that if MRI is negative patient can be discharged home with outpatient neurology follow-up.  Discussed this plan with patient as well.  Presented options for transport to patient and husband at bedside, they opted to go to [facility] via private vehicle.  They will go straight there.  I have called and spoke with Dr. Oswaldo Milian for in the [facility], ED who accepts patient for transfer for MRI.    Portions of this note were generated with [employer]. Dictation errors may occur despite best attempts at proofreading.         Final Clinical Impression(s) / ED Diagnoses Final diagnoses:  Facial droop    Rx / DC Orders ED Discharge Orders     None         Jacqlyn Larsen, [place] 06/09/22 1511    Tegeler, Gwenyth Allegra, MD 06/10/22 1343

## 2022-06-09 NOTE — ED Notes (Signed)
Patient transported to MRI 

## 2022-06-09 NOTE — Progress Notes (Addendum)
Code stroke cart activated at 1324. ER provider not visualized at bedside. Pt to CT and  Dr Quinn Axe paged at 1326. Dr. Quinn Axe on screen at 1329.  Linden Dolin, Tele Stroke RN

## 2022-06-09 NOTE — ED Provider Notes (Signed)
MRI of the brain did not show any stroke.  There were some subcortical lesions seen that are nonspecific.  I spoke with Dr. Quinn Axe the neurologist and she reviewed the films.  She recommended the patient follow-up with neurology as an outpatient.  The patient already sees neurologist and will follow-up with Dr.  She will also have her blood pressure checked in a week   Milton Ferguson, MD 06/09/22 1940

## 2022-06-09 NOTE — ED Notes (Signed)
Pt sent from [facility] as transfer for MRI due to right sided facial droop. Pt reports the right side of her face "feels heavy."

## 2022-06-09 NOTE — ED Notes (Signed)
Charge nurse contacted regarding pt coming.

## 2022-06-09 NOTE — Discharge Instructions (Signed)
Follow-up with your neurologist in the next couple weeks.  Have your blood pressure checked again in a week

## 2022-06-10 ENCOUNTER — Telehealth: Payer: Self-pay | Admitting: Anesthesiology

## 2022-06-10 NOTE — Telephone Encounter (Signed)
Pt called stating she was seen in ER for facial droop, she had an MRI and she was told to follow up with Neurologist. She would like to schedule an appt with Dr Tat asap. Pt requests call back.

## 2022-06-13 NOTE — Telephone Encounter (Signed)
I got patient sch for 06-14-22 at 2:00

## 2022-06-13 NOTE — Progress Notes (Unsigned)
Assessment/Plan:   1.   Possible complicated migraine  -Discussed with her nature and pathophysiology of complicated migraine.  -She is now off of the Vyvanse, which certainly could have been contributing to elevating her blood pressure.  -She is off of the estradiol, which we would recommend given possible complicated migraine.  -My suspicion for demyelinating disease is less, as most of the changes on the MRI are at the gray-white junction and not right along the corpus callosum.  Nonetheless, we should certainly get MRI of the cervical and thoracic spine, given that she does have an abnormal MRI of the brain.  This will help [place] distinguish demyelinating lesions from small vessel disease.  She complains about paresthesias along the orbicularis oris on the right and C5 dermatomal level right, but there is nothing pathophysiologically that really connects these two areas, but hopefully we will be able to come to some type of conclusion following the MRI.  -Lumbar puncture is offered.  This is declined today, but we can certainly revisit this.  2.  Essential Tremor             -Patient will continue on primidone, 250 mg daily.     3.  Abnormal brain scan  -Again, as above, pattern of changes is less suspicious for demyelinating disease, but her symptoms are certainly concerning and warrant further workup.  -She asks if brain scan changes can be related to rheumatoid arthritis.  I told her that this is doubtful, although we can see MRI changes related to other rheumatologic abnormalities.  I am doubtful that this is the case. Subjective:   [name] was seen today after going to the emergency room January 4 with right facial droop.  Pt with husband who supplements hx.  Records indicate that patient's blood pressure had been elevated the last several weeks.  She was started on Vyvanse about 5 months ago and she thought either the Vyvanse or her estradiol was why her blood pressure  was elevated.  Nonetheless, she noted several weeks prior to going to the emergency room that she had been having severe migraines.  She did have a history of migraines but none recently until December.  [name] Kitchen  Her headache was located in the vertex (she has no headache now).  She states headache moves locations and can sometimes be over the R eye as well as over the vertex.  She has trouble describing the quality of the headache - "its just there."  She had no nausea or vomiting.  She did stop the vyvanse over the last 4-5 days and stopped her estradiol last night because her BP was high despite stopping the Vyvanse.  She had a prednisone pak at home and started it yesterday.  Her neighbor gave her a shot of toradol Sunday night with relief but it came back.    She apparently went to the primary care physician Briscoe Deutscher) for her blood pressure prior to coming to the emergency room, and they did discuss headache and primary care thought it was migraine.  The day that she presented to the emergency room, the patient stated that she first noticed that her right eye felt heavy and had twitching.  She states that she had right eye twitch for a few days prior.  The eye heaviness felt new, however.  She went to get ready for the day about an hour later and the right side of her mouth was drooping.  MRI brain was done  in the emergency room and I personally reviewed it.  There were multiple T2 hyperintensities, but most of these were out at the gray-white junction.  DWI imaging was negative.  Contrast imaging was negative.    She describes a tightness in her neck and a bandlike sensation on her R arm.  She thinks that her typing with the R hand is a "little off."     Current prescribed movement disorder medications: Primidone, 250 mg once per day (was up to twice daily but when she started working at home she reduced it)    PREVIOUS MEDICATIONS:  propranolol, 50 mg (lowered blood pressure); primidone; artane  (states that it made tremor worse)   ALLERGIES:   Allergies  Allergen Reactions   Azithromycin Other (See Comments)   Citalopram Hydrobromide Other (See Comments)   Diclofenac Sodium Other (See Comments)   Etodolac Other (See Comments)   Nalbuphine Other (See Comments)   Nsaids Other (See Comments)    Burns stomach    Ciprofloxacin Rash   Codeine Rash   Diclofenac Sodium Rash   Doxycycline Hyclate     Upset stomach   Erythromycin Other (See Comments)    Upset stomach   Levaquin [Levofloxacin In D5w] Rash   Nubain [Nalbuphine Hcl] Rash   Penicillins Rash    Did it involve swelling of the face/tongue/throat, SOB, or low BP? No Did it involve sudden or severe rash/hives, skin peeling, or any reaction on the inside of your mouth or nose? Yes Did you need to seek medical attention at a hospital or doctor's office? Yes When did it last happen?      20 Years If all above answers are "NO", may proceed with cephalosporin use.     CURRENT MEDICATIONS:  Outpatient Encounter Medications as of 06/14/2022  Medication Sig   Aspirin-Acetaminophen-Caffeine (EXCEDRIN MIGRAINE PO) Take by mouth. Pt has been taking daily for headaches   cetirizine (ZYRTEC) 10 MG tablet Take 10 mg by mouth at bedtime.    cyanocobalamin (VITAMIN B12) 1000 MCG/ML injection Inject 1,000 mcg into the muscle every 14 (fourteen) days.   ENBREL SURECLICK 50 MG/ML injection Inject into the skin.   estradiol (ESTRACE) 1 MG tablet Take 1 tablet (1 mg total) by mouth daily.   famotidine (PEPCID) 40 MG tablet Take 40 mg by mouth in the morning and at bedtime.   FLUoxetine (PROZAC) 10 MG capsule Take 10 mg by mouth daily.   levonorgestrel (MIRENA) 20 MCG/24HR IUD 1 each by Intrauterine route once.   lisdexamfetamine (VYVANSE) 40 MG capsule 40 mg.   LORazepam (ATIVAN) 0.5 MG tablet Take 0.25 mg by mouth daily as needed for anxiety.    omeprazole (PRILOSEC) 40 MG capsule Take 1 capsule (40 mg total) by mouth daily. Take 1  capsule shortly before breakfast each day.   ondansetron (ZOFRAN-ODT) 4 MG disintegrating tablet Take 4 mg by mouth every 6 (six) hours as needed.   primidone (MYSOLINE) 250 MG tablet Take 1 tablet (250 mg total) by mouth daily.   propranolol (INDERAL) 20 MG tablet Take 20 mg by mouth 2 (two) times daily.   Vitamin D, Ergocalciferol, (DRISDOL) 1.25 MG (50000 UNIT) CAPS capsule Take by mouth.   No facility-administered encounter medications on file as of 06/14/2022.     Objective:    PHYSICAL EXAMINATION:    VITALS:   Vitals:   06/14/22 1129  BP: 128/86  Pulse: 68  SpO2: 99%  Weight: 136 lb (61.7 kg)  Height: '5\' 3"'$  (  1.6 m)       GEN:  The patient appears stated age and is in NAD. HEENT:  Normocephalic, atraumatic.  The mucous membranes are moist. The superficial temporal arteries are without ropiness or tenderness. CV:  RRR Lungs:  CTAB Neck/HEME:  There are no carotid bruits bilaterally.  Neurological examination:  Orientation: The patient is alert and oriented x3. Cranial nerves: I was not able to identify any significant facial asymmetry, although patient and husband felt that there was slight facial droop around the right orbicularis oris.  Forehead wrinkle was symmetric.  There was no Bell's phenomenon.  She was able to hold air in the cheek symmetrically.  [name] Kitchenextraocular muscles are intact.  The speech is fluent and clear. Soft palate rises symmetrically and there is no tongue deviation. Hearing is intact to conversational tone. Sensation: Sensation is intact to light touch throughout.  There is no extinction.  She notes decreased sensation over the right C5 dermatomal level.  Otherwise sensation is good and symmetric. Motor: Strength is 5/5 in the upper and lower extremities. DTRs: 2/4 throughout.  Plantar responses are neutral bilaterally. Gait and Station: She is able to ambulate in the hall normally with good armswing.  She ambulates well in a tandem fashion.  Movement  examination: Tone: There is normal tone in the UE/LE Abnormal movements: Mild postural tremor, left greater than right.  Mild intention tremor, left greater than right.     I have reviewed and interpreted the following labs independently   Chemistry      Component Value Date/Time   NA 133 (L) 06/09/2022 1444   K 3.8 06/09/2022 1444   CL 100 06/09/2022 1444   CO2 25 06/09/2022 1444   BUN 14 06/09/2022 1444   CREATININE 0.67 06/09/2022 1444      Component Value Date/Time   CALCIUM 8.9 06/09/2022 1444   ALKPHOS 50 06/09/2022 1444   AST 20 06/09/2022 1444   ALT 25 06/09/2022 1444   BILITOT 0.4 06/09/2022 1444      Lab Results  Component Value Date   WBC 5.1 06/09/2022   HGB 13.9 06/09/2022   HCT 40.5 06/09/2022   MCV 89.6 06/09/2022   PLT 276 06/09/2022   Lab Results  Component Value Date   TSH 1.60 01/29/2021     Chemistry      Component Value Date/Time   NA 133 (L) 06/09/2022 1444   K 3.8 06/09/2022 1444   CL 100 06/09/2022 1444   CO2 25 06/09/2022 1444   BUN 14 06/09/2022 1444   CREATININE 0.67 06/09/2022 1444      Component Value Date/Time   CALCIUM 8.9 06/09/2022 1444   ALKPHOS 50 06/09/2022 1444   AST 20 06/09/2022 1444   ALT 25 06/09/2022 1444   BILITOT 0.4 06/09/2022 1444     Total time spent on today's visit was 50 minutes, including both face-to-face time and nonface-to-face time.  Time included that spent on review of records (prior notes available to me/labs/imaging if pertinent), discussing treatment and goals, answering patient's questions and coordinating care.   Cc:  Cari Caraway, MD

## 2022-06-13 NOTE — Telephone Encounter (Signed)
Pt called back in wanting to schedule something this week or to speak with a nurse.

## 2022-06-13 NOTE — Telephone Encounter (Signed)
Called to make sure she received a call for appointment.

## 2022-06-14 ENCOUNTER — Ambulatory Visit (INDEPENDENT_AMBULATORY_CARE_PROVIDER_SITE_OTHER): Payer: No Typology Code available for payment source | Admitting: Neurology

## 2022-06-14 ENCOUNTER — Encounter: Payer: Self-pay | Admitting: Neurology

## 2022-06-14 VITALS — BP 128/86 | HR 68 | Ht 63.0 in | Wt 136.0 lb

## 2022-06-14 DIAGNOSIS — G43109 Migraine with aura, not intractable, without status migrainosus: Secondary | ICD-10-CM

## 2022-06-14 DIAGNOSIS — R9402 Abnormal brain scan: Secondary | ICD-10-CM

## 2022-06-14 DIAGNOSIS — G379 Demyelinating disease of central nervous system, unspecified: Secondary | ICD-10-CM

## 2022-06-14 NOTE — Patient Instructions (Signed)
A referral to [facility] has been placed for your MRI someone will contact you directly to schedule your appt. They are located at [address] Please contact them directly by calling [phone] with any questions regarding your referral.  

## 2022-06-20 ENCOUNTER — Other Ambulatory Visit: Payer: Self-pay

## 2022-06-20 DIAGNOSIS — G379 Demyelinating disease of central nervous system, unspecified: Secondary | ICD-10-CM

## 2022-06-20 DIAGNOSIS — R9402 Abnormal brain scan: Secondary | ICD-10-CM

## 2022-06-21 ENCOUNTER — Encounter: Payer: Self-pay | Admitting: Neurology

## 2022-07-03 ENCOUNTER — Ambulatory Visit
Admission: RE | Admit: 2022-07-03 | Discharge: 2022-07-03 | Disposition: A | Discharge status: Home / Self Care | Payer: No Typology Code available for payment source | Source: Ambulatory Visit | Attending: Neurology | Admitting: Neurology

## 2022-07-03 DIAGNOSIS — G379 Demyelinating disease of central nervous system, unspecified: Secondary | ICD-10-CM

## 2022-07-04 ENCOUNTER — Encounter: Payer: Self-pay | Admitting: Neurology

## 2022-07-11 ENCOUNTER — Ambulatory Visit: Payer: No Typology Code available for payment source

## 2022-08-03 ENCOUNTER — Telehealth: Payer: Self-pay | Admitting: Neurology

## 2022-08-03 NOTE — Telephone Encounter (Signed)
Sent patient a Therapist, music to call to make a earlier appt with Clarise Cruz for headaches

## 2022-08-03 NOTE — Telephone Encounter (Signed)
I got a correspondence from [facility] from optometrist Dr Molinda Bailiff.  Notes indicate that she was told that neurologist was "concerned about possible MS related white matter lesions on MRI."  Her notes indicate that patient had distinct optic nerves bilaterally.  Stated that nerve scans showed inferior quadrant thinning and macular scan showed inferior retinal thinning on the right.  No thinning on the left.  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  Patient was instructed to follow-up in her clinic in 2 weeks for visual field exam and fundus photos.  Per my last messages to patient via [place] on July 04, 2022, the patient was told to call the office to schedule an appointment with Clarise Cruz for headache.  She read the message at 2:33 PM on July 06, 2022 but she did not call back to schedule that appointment.  She does have an appointment later in the year with Clarise Cruz that was scheduled in October.  Hinton Dyer, feel free to send her 1 more message that if she wants to schedule an earlier appointment with Clarise Cruz if she continues to have HA, she can do so.  If not, she can keep appt with Clarise Cruz in October to repeat scans.

## 2022-08-04 ENCOUNTER — Ambulatory Visit (HOSPITAL_COMMUNITY)
Admission: RE | Admit: 2022-08-04 | Discharge: 2022-08-04 | Disposition: A | Discharge status: Home / Self Care | Payer: No Typology Code available for payment source | Source: Ambulatory Visit | Attending: Vascular Surgery | Admitting: Vascular Surgery

## 2022-08-04 ENCOUNTER — Other Ambulatory Visit (HOSPITAL_COMMUNITY): Payer: Self-pay | Admitting: Family Medicine

## 2022-08-04 DIAGNOSIS — H349 Unspecified retinal vascular occlusion: Secondary | ICD-10-CM

## 2022-08-18 ENCOUNTER — Ambulatory Visit
Admission: RE | Admit: 2022-08-18 | Discharge: 2022-08-18 | Disposition: A | Discharge status: Home / Self Care | Payer: No Typology Code available for payment source | Source: Ambulatory Visit | Attending: Obstetrics & Gynecology | Admitting: Obstetrics & Gynecology

## 2022-08-18 ENCOUNTER — Other Ambulatory Visit: Payer: Self-pay | Admitting: Gastroenterology

## 2022-08-18 DIAGNOSIS — Z1231 Encounter for screening mammogram for malignant neoplasm of breast: Secondary | ICD-10-CM

## 2022-08-18 NOTE — Telephone Encounter (Signed)
[name] please refill for 1 year

## 2022-08-18 NOTE — Telephone Encounter (Signed)
This is a patient of Dr Ardis Hughs. Please advise if OK to refills as you are DOD pm.

## 2022-08-25 ENCOUNTER — Telehealth: Payer: Self-pay | Admitting: Neurology

## 2022-08-25 NOTE — Telephone Encounter (Signed)
I got the following message from the front office staff in a staff message:  "Pt called in after seeing the mychart message from [facility] for her to call and move her appointment up sooner from October with Clarise Cruz. I offered her March appointments then May. Clarise Cruz does not have any open April appointments. She asked if she can see you instead in April given the situation? She can't come in March and doesn't want to wait until May."  I'm reviewing pts chart and we sent her a message and called her on 1/31.  She read and responded to the message on 07/06/22 but was asked to call the office to schedule an appt.  She didn't do that.  We sent her another message on 2/28 to call us and she last read it on 08/19/22.  She has now declined a March appt and a May appt but wants worked in during the month of April.  Unfortunately, neither Clarise Cruz nor I have appts in April right now.  We are trying to help her and if she wants an appt she will need to schedule this with Clarise Cruz.

## 2022-08-26 NOTE — Telephone Encounter (Signed)
LMOM  to get patient scheduled with Clarise Cruz will try her back at a later time

## 2022-08-30 ENCOUNTER — Telehealth: Payer: Self-pay | Admitting: Neurology

## 2022-08-30 NOTE — Telephone Encounter (Signed)
Left message to try to get patient sch for Clarise Cruz sooner than her oct appt

## 2022-09-06 ENCOUNTER — Telehealth: Payer: Self-pay

## 2022-09-06 NOTE — Telephone Encounter (Signed)
Per DPR access note on file I left detailed message in voice mail box.  I asked her to check with ins co on [place] to see if covered me for her and if so, to schedule a visit with Dr Marguerita Merles.

## 2022-09-06 NOTE — Telephone Encounter (Signed)
Patient called in voice mail. She said back in January she had to d/c Estradiol tabs because of concern over possible DVT.  She said she is having terrible nightsweats and stays "drenched'..  She questioned if Dr. Marguerita Merles thinks she would be a candidate for the new medication she has seen advertised on TV and her PCP mentioned and told her to talk with Dr. Marguerita Merles about.  ? [place].

## 2022-09-06 NOTE — Telephone Encounter (Signed)
Ann Bruins, MD  You9 minutes ago (3:00 PM)    Good idea, but recommend checking insurance coverage.  If covered, schedule a visit with me.  May need specialized pharmacy. Dr Carlean Jews

## 2022-09-08 NOTE — Telephone Encounter (Signed)
Pt returning call stating that insurance will not cover [name] so has concerns about what other options she could consider for her symptoms being that with her history she cannot take any hormone related medications?   FYI. Pt has not had AEX since 01/29/2021. Shall I have her schedule annual and can discuss options for vasomotor sxs of menopause mgmt then? Please advise.

## 2022-09-08 NOTE — Telephone Encounter (Signed)
Per ML: "Yes, will discuss at Annual Gyn exam.  Alternative to hormones for hot flushes are Anti-Depressants."  Pt notified and voiced understanding. Msg sent to appt desk.

## 2022-09-16 ENCOUNTER — Encounter: Payer: Self-pay | Admitting: Obstetrics & Gynecology

## 2022-09-16 ENCOUNTER — Other Ambulatory Visit (HOSPITAL_COMMUNITY)
Admission: RE | Admit: 2022-09-16 | Discharge: 2022-09-16 | Disposition: A | Discharge status: Home / Self Care | Payer: No Typology Code available for payment source | Source: Ambulatory Visit | Attending: Obstetrics & Gynecology | Admitting: Obstetrics & Gynecology

## 2022-09-16 ENCOUNTER — Ambulatory Visit (INDEPENDENT_AMBULATORY_CARE_PROVIDER_SITE_OTHER): Payer: No Typology Code available for payment source | Admitting: Obstetrics & Gynecology

## 2022-09-16 VITALS — BP 120/74 | HR 73 | Ht 62.75 in | Wt 143.0 lb

## 2022-09-16 DIAGNOSIS — N951 Menopausal and female climacteric states: Secondary | ICD-10-CM | POA: Diagnosis not present

## 2022-09-16 DIAGNOSIS — Z30431 Encounter for routine checking of intrauterine contraceptive device: Secondary | ICD-10-CM | POA: Diagnosis not present

## 2022-09-16 DIAGNOSIS — I639 Cerebral infarction, unspecified: Secondary | ICD-10-CM | POA: Diagnosis not present

## 2022-09-16 DIAGNOSIS — Z01419 Encounter for gynecological examination (general) (routine) without abnormal findings: Secondary | ICD-10-CM | POA: Diagnosis not present

## 2022-09-16 NOTE — Progress Notes (Signed)
[name] 1971/01/20 [id number]   History:    52 y.o.  G2P2L2 Married.  Son 46 yo, daughter 50 yo.   RP:  Established patient presenting for annual gyn exam    HPI: Had increased BP and a stroke in 06/2022 on Estradiol.  Stopped Estradiol. Followed by Cardio. Having hot flushes and weight gain.  On Prozac 10 mg daily.  BMI 25.53.  Well on Mirena IUD x 06/2018.  No abnormal vaginal discharge.  No recent IC.  H/O LEEP about 15 yrs ago.  Last Pap 06/2019 Negative/HPV HR Neg.  Pap reflex today. Urine/BMs normal.  Breasts normal. Mammo Neg 08/2022.  Colono 08/2014. [facility] with Fam MD.    PAP: 06-10-19, MMG: 08-18-22, COLONOSCOPY: 08-27-14   Past medical history,surgical history, family history and social history were all reviewed and documented in the EPIC chart.  Gynecologic History No LMP recorded. (Menstrual status: IUD).  Obstetric History OB History  Gravida Para Term Preterm AB Living  2 2 2     2   SAB IAB Ectopic Multiple Live Births          2    # Outcome Date GA Lbr Len/2nd Weight Sex Delivery Anes PTL Lv  2 Term           1 Term              ROS: A ROS was performed and pertinent positives and negatives are included in the history. GENERAL: No fevers or chills. HEENT: No change in vision, no earache, sore throat or sinus congestion. NECK: No pain or stiffness. CARDIOVASCULAR: No chest pain or pressure. No palpitations. PULMONARY: No shortness of breath, cough or wheeze. GASTROINTESTINAL: No abdominal pain, nausea, vomiting or diarrhea, melena or bright red blood per rectum. GENITOURINARY: No urinary frequency, urgency, hesitancy or dysuria. MUSCULOSKELETAL: No joint or muscle pain, no back pain, no recent trauma. DERMATOLOGIC: No rash, no itching, no lesions. ENDOCRINE: No polyuria, polydipsia, no heat or cold intolerance. No recent change in weight. HEMATOLOGICAL: No anemia or easy bruising or bleeding. NEUROLOGIC: No headache, seizures, numbness, tingling or  weakness. PSYCHIATRIC: No depression, no loss of interest in normal activity or change in sleep pattern.     Exam:   BP 120/74   Pulse 73   Ht 5' 2.75" (1.594 m)   Wt 143 lb (64.9 kg)   SpO2 99%   BMI 25.53 kg/m   Body mass index is 25.53 kg/m.  General appearance : Well developed well nourished female. No acute distress HEENT: Eyes: no retinal hemorrhage or exudates,  Neck supple, trachea midline, no carotid bruits, no thyroidmegaly Lungs: Clear to auscultation, no rhonchi or wheezes, or rib retractions  Heart: Regular rate and rhythm, no murmurs or gallops Breast:Examined in sitting and supine position were symmetrical in appearance, no palpable masses or tenderness,  no skin retraction, no nipple inversion, no nipple discharge, no skin discoloration, no axillary or supraclavicular lymphadenopathy Abdomen: no palpable masses or tenderness, no rebound or guarding Extremities: no edema or skin discoloration or tenderness  Pelvic: Vulva: Normal             Vagina: No gross lesions or discharge  Cervix: No gross lesions or discharge.  IUD strings visible at [facility].  Pap reflex done.  Uterus  AV, normal size, shape and consistency, non-tender and mobile  Adnexa  Without masses or tenderness  Anus: Normal   Assessment/Plan:  52 y.o. female for annual exam   1. Encounter  for routine gynecological examination with Papanicolaou smear of cervix Had increased BP and a stroke in 06/2022 on Estradiol.  Stopped Estradiol. Followed by Cardio. Having hot flushes and weight gain.  On Prozac 10 mg daily.  BMI 25.53.  Well on Mirena IUD x 06/2018. No abnormal vaginal discharge.  No recent IC.  H/O LEEP about 15 yrs ago.  Last Pap 06/2019 Negative/HPV HR Neg.  Pap reflex today. Urine/BMs normal.  Breasts normal. Mammo Neg 08/2022.  Colono 08/2014. [facility] with Fam MD.  - Cytology - PAP( [place])  2. Menopausal syndrome Recommend increasing Prozac through Fam MD.  3. IUD check up Well on  Mirena IUD x 06/2018. Mirena IUD in good position.  Discussed switching to ParaGard IUD.  Will ask recommendation of Cardio.  4. Cerebrovascular accident (CVA), unspecified mechanism  Other orders - fexofenadine (ALLEGRA) 180 MG tablet; Take 180 mg by mouth daily. - ASPIRIN 81 PO; Take by mouth.   Genia Del MD, 11:22 AM

## 2022-09-21 LAB — CYTOLOGY - PAP: Diagnosis: NEGATIVE

## 2022-10-05 ENCOUNTER — Ambulatory Visit: Payer: No Typology Code available for payment source

## 2022-10-05 ENCOUNTER — Telehealth: Payer: Self-pay

## 2022-10-05 NOTE — Telephone Encounter (Signed)
Multiple attempts made to reach patient; multiple messages left for patient to call back to the office prior to 5 pm to reschedule PV appt; No show letter being sent to patient via MyChart;  PV and procedures appt cancelled;

## 2022-10-12 ENCOUNTER — Encounter: Payer: No Typology Code available for payment source | Admitting: Internal Medicine

## 2022-11-21 ENCOUNTER — Other Ambulatory Visit: Payer: Self-pay | Admitting: Family Medicine

## 2022-11-21 DIAGNOSIS — Z8379 Family history of other diseases of the digestive system: Secondary | ICD-10-CM

## 2022-12-06 ENCOUNTER — Ambulatory Visit
Admission: RE | Admit: 2022-12-06 | Discharge: 2022-12-06 | Disposition: A | Discharge status: Home / Self Care | Payer: No Typology Code available for payment source | Source: Ambulatory Visit | Attending: Family Medicine | Admitting: Family Medicine

## 2022-12-06 DIAGNOSIS — Z8379 Family history of other diseases of the digestive system: Secondary | ICD-10-CM

## 2023-01-04 ENCOUNTER — Telehealth: Payer: Self-pay

## 2023-01-04 NOTE — Telephone Encounter (Signed)
Patient called in stating she has had an ocular inclusion and is on stroke protocol with her eye doctor at [facility]. IO have called them and asked for scans as they are telling her to see her neurologist for her migraines with Aura . 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

## 2023-01-05 NOTE — Telephone Encounter (Signed)
Called patient and relayed Dr. Iona Beard recommendations looked up headache clinic and gave her number sent notes to headache clinic told patient if her vision is worse to go back to eye doc immediately

## 2023-02-08 ENCOUNTER — Encounter: Payer: Self-pay | Admitting: Physician Assistant

## 2023-02-08 ENCOUNTER — Ambulatory Visit (INDEPENDENT_AMBULATORY_CARE_PROVIDER_SITE_OTHER): Payer: No Typology Code available for payment source | Admitting: Physician Assistant

## 2023-02-08 VITALS — BP 110/64 | HR 72 | Ht 63.0 in | Wt 131.0 lb

## 2023-02-08 DIAGNOSIS — R1013 Epigastric pain: Secondary | ICD-10-CM

## 2023-02-08 DIAGNOSIS — Z1211 Encounter for screening for malignant neoplasm of colon: Secondary | ICD-10-CM | POA: Diagnosis not present

## 2023-02-08 DIAGNOSIS — Z1212 Encounter for screening for malignant neoplasm of rectum: Secondary | ICD-10-CM | POA: Diagnosis not present

## 2023-02-08 DIAGNOSIS — K219 Gastro-esophageal reflux disease without esophagitis: Secondary | ICD-10-CM

## 2023-02-08 MED ORDER — FAMOTIDINE 40 MG PO TABS: 40.0000 mg | ORAL_TABLET | Freq: Two times a day (BID) | ORAL | 3 refills | Status: DC

## 2023-02-08 MED ORDER — OMEPRAZOLE 40 MG PO CPDR: 40.0000 mg | DELAYED_RELEASE_CAPSULE | Freq: Two times a day (BID) | ORAL | 3 refills | Status: DC

## 2023-02-08 MED ORDER — SUCRALFATE 1 G PO TABS: 1.0000 g | ORAL_TABLET | Freq: Three times a day (TID) | ORAL | 3 refills | Status: DC

## 2023-02-08 NOTE — Progress Notes (Signed)
.  Chief Complaint: GERD  HPI:    [name] is a 52 year old female with a past medical history as listed below including anxiety, fibromyalgia, reflux and rheumatoid arthritis, previously known to Dr. Christella Frazier, who was referred to me by Ann Dimitri, MD for a complaint of GERD.      08/27/2014 colonoscopy normal.  Repeat recommended 10 years.    08/15/2019 EGD for heartburn and Bravo placement.  EGD was normal, previously noted moderate gastritis that healed.  Bravo pH capsule was deployed.  Bravo reviewed and at that time discussed that she did not have pathologic acid reflux on her current antiacid regimen (H2 blocker twice daily).    12/06/2022 right upper quadrant ultrasound with no acute process.  Liver was normal with no increase in echogenicity.  1.7 cm cyst in the right hepatic lobe.    Today, the patient tells me that 6 weeks ago she took a leave of absence from her work because she was under so much stress and was having daily migraines and felt like her reflux was flaring, this has continued over the past month or so regardless of her not working any further.  Currently she is using Famotidine 40 mg and Omeprazole 40 mg in the morning when she drinks her coffee as well as In the evening before she goes to bed.  Sometimes she will switch out Zegerid for her Prilosec.  (Was unaware these were not the same sort of medicine).  Also very occasional use Carafate when she feels like she needs to "push the food down".  Describes it feels like she has a brick in her epigastrium currently.  Tells me sometimes she will have reflux even with just drinking water.  Apparently this is not as bad though as it was back in 2021 when she had her EGD.  Recalls at that time she had to use Meloxicam for rheumatoid arthritis flare and she thinks that is what made all of her symptoms worse.  Currently she is on a prednisone taper for some symptoms.  Denies NSAID use.    Neighbors with [name] and some of our [facility]  team.    Denies fever, chills, weight loss or blood in her stool.  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    Past Surgical History:  Procedure Laterality Date   BRAVO [facility] STUDY N/A 08/15/2019   Procedure: BRAVO PH STUDY;  Surgeon: Rachael Fee, MD;  Location: [facility];  Service: Endoscopy;  Laterality: N/A;   CHOLECYSTECTOMY     ESOPHAGOGASTRODUODENOSCOPY (EGD) WITH PROPOFOL N/A 08/15/2019   Procedure: ESOPHAGOGASTRODUODENOSCOPY (EGD) WITH PROPOFOL;  Surgeon: Rachael Fee, MD;  Location: [facility];  Service: Endoscopy;  Laterality: N/A;   INTRAUTERINE DEVICE (IUD) INSERTION     mirena insertion 07-05-18   ovarian tumor     benign   TONSILLECTOMY      Current Outpatient Medications  Medication Sig Dispense Refill   ASPIRIN 81 PO Take by mouth.     Aspirin-Acetaminophen-Caffeine (EXCEDRIN MIGRAINE PO) Take by mouth. Pt has been taking daily for headaches     cetirizine (ZYRTEC) 10 MG tablet Take 10 mg by mouth  at bedtime.      cyanocobalamin (VITAMIN B12) 1000 MCG/ML injection Inject 1,000 mcg into the muscle every 14 (fourteen) days.     ENBREL SURECLICK 50 MG/ML injection Inject into the skin.     famotidine (PEPCID) 40 MG tablet Take 40 mg by mouth in the morning and at bedtime.     fexofenadine (ALLEGRA) 180 MG tablet Take 180 mg by mouth daily.     FLUoxetine (PROZAC) 10 MG capsule Take 10 mg by mouth daily.     levonorgestrel (MIRENA) 20 MCG/24HR IUD 1 each by Intrauterine route once.     omeprazole (PRILOSEC) 40 MG capsule TAKE 1 CAPSULE SHORTLY BEFORE BREAKFAST EACH DAY. 90 capsule 3   ondansetron (ZOFRAN-ODT) 4 MG disintegrating tablet Take 4 mg by mouth every 6 (six) hours as needed.     predniSONE (DELTASONE) 5  MG tablet TAKE 3 TABLETS FOR 3 DAYS, 2 TABLETS FOR 3 DAYS, 1 TABLET FOR 3 DAYS THEN STOP Orally Once a day for 9 days     primidone (MYSOLINE) 250 MG tablet Take 1 tablet (250 mg total) by mouth daily. 90 tablet 3   propranolol (INDERAL) 20 MG tablet Take 20 mg by mouth 2 (two) times daily.     sucralfate (CARAFATE) 1 g tablet Take 1 g by mouth as needed.     Vitamin D, Ergocalciferol, (DRISDOL) 1.25 MG (50000 UNIT) CAPS capsule Take by mouth.     ZEPBOUND 15 MG/0.5ML Pen Inject 2 mg into the skin once a week.     zonisamide (ZONEGRAN) 25 MG capsule Take 50 mg by mouth at bedtime. For mirgraines     No current facility-administered medications for this visit.    Allergies as of 02/08/2023 - Review Complete 02/08/2023  Allergen Reaction Noted   Azithromycin Other (See Comments) 07/25/2016   Citalopram hydrobromide Other (See Comments) 11/01/2021   Diclofenac sodium Other (See Comments) 11/01/2021   Estrogens Other (See Comments) 02/08/2023   Etodolac Other (See Comments) 11/01/2021   Nalbuphine Other (See Comments) 11/01/2021   Nsaids Other (See Comments) 08/08/2019   Ciprofloxacin Rash 07/25/2016   Codeine Rash 07/25/2016   Diclofenac sodium Rash 07/25/2016   Doxycycline hyclate  07/25/2016   Erythromycin Other (See Comments) 07/25/2016   Levaquin [levofloxacin in d5w] Rash 07/25/2016   Nubain [nalbuphine hcl] Rash 07/25/2016   Penicillins Rash 07/25/2016    Family History  Problem Relation Age of Onset   Rheumatologic disease Father    Tremor Father    Colon polyps Father    Stroke Father    Atrial fibrillation Father    Other Sister        covid pneumonia   Heart attack Sister    Colon cancer Maternal Uncle        died 67   Colon polyps Paternal Aunt    Colon cancer Paternal Grandmother        died 50   Heart attack Paternal Grandfather    Depression Daughter    ADD / ADHD Son    Other Son        dyslexia    Social History   Socioeconomic History   Marital  status: Married    Spouse name: Not on file   Number of children: 2   Years of education: Not on file   Highest education level: Not on file  Occupational History   Occupation: Interior and spatial designer Account management  Tobacco Use   Smoking status: Never   Smokeless tobacco: Never  Vaping Use   Vaping status: Never Used  Substance and Sexual Activity   Alcohol use: Yes    Alcohol/week: 3.0 standard drinks of alcohol    Types: 3 Glasses of wine per week    Comment: 3 GLASSES A WEEK    Drug use: No   Sexual activity: Yes    Partners: Male    Birth control/protection: I.U.D.    Comment: 1st intercourse- 18, partners- 3  Other Topics Concern   Not on file  Social History Narrative   Left handed    Social Determinants of Health   Financial Resource Strain: Not on file  Food Insecurity: Not on file  Transportation Needs: Not on file  Physical Activity: Not on file  Stress: Not on file  Social Connections: Unknown (09/20/2022)   [facility]   Social Network    Social Network: Not on file  Intimate Partner Violence: Unknown (09/20/2022)   Received from [employer], [facility]   HITS    Physically Hurt: Not on file    Insult or Talk Down To: Not on file    Threaten Physical Harm: Not on file    Scream or Curse: Not on file    Review of Systems:    Constitutional: No weight loss, fever or chills Skin: No rash  Cardiovascular: No chest pain Respiratory: No SOB  Gastrointestinal: See HPI and otherwise negative Genitourinary: No dysuria Neurological: +migraine Musculoskeletal: No new muscle or joint pain Hematologic: No bleeding  Psychiatric: No history of depression or anxiety   Physical Exam:  Vital signs: BP 110/64   Pulse 72   Ht 5\' 3"  (1.6 m)   Wt 131 lb (59.4 kg)   BMI 23.21 kg/m    Constitutional:   Pleasant Caucasian female appears to be in NAD, Well developed, Well nourished, alert and cooperative Respiratory: Respirations even and  unlabored. Lungs clear to auscultation bilaterally.   No wheezes, crackles, or rhonchi.  Cardiovascular: Normal S1, S2. No MRG. Regular rate and rhythm. No peripheral edema, cyanosis or pallor.  Gastrointestinal:  Soft, nondistended, moderate epigastric pain. No rebound or guarding. Normal bowel sounds. No appreciable masses or hepatomegaly. Rectal:  Not performed.  Psychiatric: Demonstrates good judgement and reason without abnormal affect or behaviors.  RELEVANT LABS AND IMAGING: CBC    Component Value Date/Time   WBC 5.1 06/09/2022 1444   RBC 4.52 06/09/2022 1444   HGB 13.9 06/09/2022 1444   HCT 40.5 06/09/2022 1444   PLT 276 06/09/2022 1444   MCV 89.6 06/09/2022 1444   MCH 30.8 06/09/2022 1444   MCHC 34.3 06/09/2022 1444   RDW 12.5 06/09/2022 1444   LYMPHSABS 1.2 06/09/2022 1444   MONOABS 0.5 06/09/2022 1444   EOSABS 0.2 06/09/2022 1444   BASOSABS 0.1 06/09/2022 1444    CMP     Component Value Date/Time   NA 133 (L) 06/09/2022 1444   K 3.8 06/09/2022 1444   CL 100 06/09/2022 1444   CO2 25 06/09/2022 1444   GLUCOSE 91 06/09/2022 1444   BUN 14 06/09/2022 1444   CREATININE 0.67 06/09/2022 1444   CALCIUM 8.9 06/09/2022 1444   PROT 7.0 06/09/2022 1444   ALBUMIN 4.3 06/09/2022 1444   AST 20 06/09/2022 1444   ALT 25 06/09/2022 1444   ALKPHOS 50 06/09/2022 1444   BILITOT 0.4 06/09/2022 1444   GFRNONAA >60 06/09/2022 1444   GFRAA  12/01/2006 1840    >60  The eGFR has been calculated using the MDRD equation. This calculation has not been validated in all clinical    Assessment: 1.  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  Screening for colorectal cancer: Due March 2026 3.  Epigastric pain: With reflux as above  Plan: 1.  Went over medications and discuss timing.  Recommend she use  her Omeprazole 40 mg twice daily, 30-60 minutes before breakfast and dinner.  She has enough of this medication at home. 2.  Continue Pepcid prescribed 40 mg twice daily, every morning and nightly #60 with 5 refills. 3.  Recommend she restart Carafate and refilled this for her.  1 g 4 times daily, as often as possible in between her other meds, an hour before or 2 hours after other medications. 4.  Discussed with patient that if she does not improve on this regimen then we may need to consider a repeat EGD to take another look.  She would like to avoid for now. 5.  Patient to follow in clinic with me in 2 to 3 months or sooner if necessary.  Assigned patient to Dr. Lavon Paganini as she requested her and lieu of Dr. Larae Grooms absence.  Hyacinth Meeker, PA-C [facility] 02/08/2023, 11:19 AM  Cc: Ann Dimitri, MD

## 2023-02-08 NOTE — Patient Instructions (Signed)
We have sent the following medications to your pharmacy for you to pick up at your convenience: Omeprazole 40 mg twice daily 30-60 minutes before breakfast and dinner.  Pepcid 40 mg twice daily before breakfast and at bedtime . Carafate 1 g four times daily at least 1 hour prior to meals or 2 hours after meals.   _______________________________________________________  If your blood pressure at your visit was 140/90 or greater, please contact your primary care physician to follow up on this.  _______________________________________________________  If you are age 26 or older, your body mass index should be between 23-30. Your Body mass index is 23.21 kg/m. If this is out of the aforementioned range listed, please consider follow up with your Primary Care Provider.  If you are age 27 or younger, your body mass index should be between 19-25. Your Body mass index is 23.21 kg/m. If this is out of the aformentioned range listed, please consider follow up with your Primary Care Provider.   ________________________________________________________  The [facility] providers would like to encourage you to use [facility] to communicate with providers for non-urgent requests or questions.  Due to long hold times on the telephone, sending your provider a message by [facility] may be a faster and more efficient way to get a response.  Please allow 48 business hours for a response.  Please remember that this is for non-urgent requests.  _______________________________________________________

## 2023-03-03 ENCOUNTER — Telehealth: Payer: Self-pay | Admitting: Neurology

## 2023-03-03 NOTE — Telephone Encounter (Signed)
Please schedule the mri and let pt know its time for repeat.  She has f/u with sara next month so make sure done prior to that.

## 2023-03-03 NOTE — Telephone Encounter (Signed)
-----   Message from [place] Lauraine Crespo sent at 07/04/2022  5:53 PM EST ----- Needs repeat mri brain with and without.  Dx:  abnormal mri brain

## 2023-03-06 ENCOUNTER — Other Ambulatory Visit: Payer: Self-pay

## 2023-03-06 DIAGNOSIS — R9402 Abnormal brain scan: Secondary | ICD-10-CM

## 2023-03-06 NOTE — Telephone Encounter (Signed)
Order has been put in and sent to [facility]. Patient has been called and notified

## 2023-03-17 ENCOUNTER — Ambulatory Visit
Admission: RE | Admit: 2023-03-17 | Discharge: 2023-03-17 | Disposition: A | Discharge status: Home / Self Care | Payer: No Typology Code available for payment source | Source: Ambulatory Visit | Attending: Neurology | Admitting: Neurology

## 2023-03-17 DIAGNOSIS — R9402 Abnormal brain scan: Secondary | ICD-10-CM

## 2023-03-17 MED ORDER — GADOPICLENOL 0.5 MMOL/ML IV SOLN
5.5000 mL | Freq: Once | INTRAVENOUS | Status: AC | PRN
  Administered 2023-03-17: 5.5 mL via INTRAVENOUS

## 2023-03-20 ENCOUNTER — Telehealth: Payer: Self-pay | Admitting: Neurology

## 2023-03-20 NOTE — Telephone Encounter (Signed)
Called patient and made her aware of results per Dr. Arbutus Leas. She understood and will see Huntley Dec in a week.

## 2023-03-20 NOTE — Telephone Encounter (Signed)
Pt called in stating she had gotten a voicemail from [place] and didn't quite understand it. There was a doctor mentioned she didn't know who it was. She would like to speak with [facility]

## 2023-03-27 ENCOUNTER — Encounter: Payer: Self-pay | Admitting: Physician Assistant

## 2023-03-27 ENCOUNTER — Ambulatory Visit (INDEPENDENT_AMBULATORY_CARE_PROVIDER_SITE_OTHER): Payer: No Typology Code available for payment source | Admitting: Physician Assistant

## 2023-03-27 VITALS — BP 108/74 | HR 74 | Resp 18 | Ht 63.0 in | Wt 125.0 lb

## 2023-03-27 DIAGNOSIS — G379 Demyelinating disease of central nervous system, unspecified: Secondary | ICD-10-CM

## 2023-03-27 DIAGNOSIS — G250 Essential tremor: Secondary | ICD-10-CM | POA: Diagnosis not present

## 2023-03-27 DIAGNOSIS — R930 Abnormal findings on diagnostic imaging of skull and head, not elsewhere classified: Secondary | ICD-10-CM

## 2023-03-27 NOTE — Patient Instructions (Addendum)
Continue primidone and propanolol as prescribed Follow with Dr. Arbutus Leas in 1 year Referral to [facility] for suspicious lesions in  brain MR, r/o demyelinating disorder Dr Epimenio Foot  Continue baby ASA daily for stroke prevention  Continue psychotherapy .  Follow at the [facility] clinic with Dr. Neale Burly.

## 2023-03-27 NOTE — Progress Notes (Addendum)
Assessment/Plan:      Possible complicated migraine     MRI brain lesions of unknown etiology  This is followed by Dr. Neale Burly at [facility].  She is no longer in Vyvanse , BPs are better controlled  MRI brain stable. Lesions are essentially unchanged from prior MRI. Once again, the lesions are the periphery of gray matter, absent around the CC. MRI C and T spine were normal, without evidence of demyelinating disease  Patient agreed to have an evaluation with Dr. Epimenio Foot at [facility] to gain further  insight into these lesions. At this time MS may be less likely, and rather be of chronic mild vascular etiology.      Essential Tremor, stable              -Patient  is on primidone, 250 mg daily  and propanolol 20 mg bid .      Subjective:   [name]  returns for follow up to discuss MRI brain, C ad T spine. She is here alone.    MRI brain is stable as above, but she is concerned about the possibility of MS.  She denies double vision. 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   Motor: Denies any weakness in the arms or face.  She always had some left foot clumsiness with at least 20 years, she attributes this to RA.  Denies muscle stiffness, or painful muscle spasms. Sensory denies feelings of pins or needles, or tingling.  Denies any buzzing or crawling sensations.  Pain: Yes due RA Cognitive: She denies significant difficulty remembering or learning new information, or organizing.  She has some issue with maintaining attention due to her history of ADHD although due to the medication she is taking at this moment, she is unable to take a medication for ADHD due to potential interactions. Fatigue?  Endorsed, she reports that this is a byproduct of RA. Sleep?  For the  last 2 weeks she may have some RLS. Gait: Denies any issues.  Denies any vertigo, "only when increasing the medication for headaches ".. Bowel/Bladder: Denies any issues. Cognition: "Memory is not great ".  Sometimes she has trouble remembering where she was coming to a room for. Mood: She has history of menopause since 69, which may affect her mental.  She is seeing a therapist.     PREVIOUS MEDICATIONS: primidone; artane (states that it made tremor worse)   ALLERGIES:   Allergies  Allergen Reactions   Azithromycin Other (See Comments)   Citalopram Hydrobromide Other (See Comments)   Diclofenac Sodium Other (See Comments)   Estrogens Other (See Comments)    Blood clot to right eye   Etodolac Other (See Comments)   Nalbuphine Other (See Comments)   Nsaids Other (See Comments)    Burns stomach    Ciprofloxacin Rash   Codeine Rash   Diclofenac Sodium Rash   Doxycycline Hyclate     Upset stomach   Erythromycin Other (See Comments)    Upset stomach   Levaquin [Levofloxacin In D5w] Rash   Nubain [Nalbuphine Hcl] Rash   Penicillins Rash    Did it involve swelling of the face/tongue/throat, SOB, or low BP? No Did it involve sudden or severe rash/hives, skin  peeling, or any reaction on the inside of your mouth or nose? Yes Did you need to seek medical attention at a hospital or doctor's office? Yes When did it last happen?      20 Years If all above answers are "NO", may proceed with cephalosporin use.     CURRENT MEDICATIONS:  Outpatient Encounter Medications as of 03/27/2023  Medication Sig   ASPIRIN 81 PO Take by mouth.   cetirizine (ZYRTEC) 10 MG tablet Take 10 mg by mouth at bedtime.    cyanocobalamin (VITAMIN B12) 1000 MCG/ML injection Inject 1,000 mcg into the muscle every 14 (fourteen) days.   ENBREL SURECLICK 50 MG/ML injection Inject into the skin.   famotidine (PEPCID) 40 MG tablet Take 1 tablet (40 mg total) by mouth in the morning and at bedtime.   fexofenadine  (ALLEGRA) 180 MG tablet Take 180 mg by mouth daily.   FLUoxetine (PROZAC) 10 MG capsule Take 20 mg by mouth daily.   levonorgestrel (MIRENA) 20 MCG/24HR IUD 1 each by Intrauterine route once.   omeprazole (PRILOSEC) 40 MG capsule Take 1 capsule (40 mg total) by mouth 2 (two) times daily before a meal.   ondansetron (ZOFRAN-ODT) 4 MG disintegrating tablet Take 4 mg by mouth every 6 (six) hours as needed.   primidone (MYSOLINE) 250 MG tablet Take 1 tablet (250 mg total) by mouth daily.   propranolol (INDERAL) 20 MG tablet Take 20 mg by mouth 2 (two) times daily.   sucralfate (CARAFATE) 1 g tablet Take 1 g by mouth as needed.   sucralfate (CARAFATE) 1 g tablet Take 1 tablet (1 g total) by mouth 4 (four) times daily -  with meals and at bedtime.   Vitamin D, Ergocalciferol, (DRISDOL) 1.25 MG (50000 UNIT) CAPS capsule Take by mouth.   ZEPBOUND 15 MG/0.5ML Pen Inject 2 mg into the skin once a week.   zonisamide (ZONEGRAN) 25 MG capsule Take 50 mg by mouth at bedtime. For mirgraines   Aspirin-Acetaminophen-Caffeine (EXCEDRIN MIGRAINE PO) Take by mouth. Pt has been taking daily for headaches (Patient not taking: Reported on 03/27/2023)   predniSONE (DELTASONE) 5 MG tablet TAKE 3 TABLETS FOR 3 DAYS, 2 TABLETS FOR 3 DAYS, 1 TABLET FOR 3 DAYS THEN STOP Orally Once a day for 9 days (Patient not taking: Reported on 03/27/2023)   No facility-administered encounter medications on file as of 03/27/2023.     Objective:    PHYSICAL EXAMINATION:    VITALS:   Vitals:   03/27/23 1047  BP: 108/74  Pulse: 74  Resp: 18  SpO2: 99%  Weight: 125 lb (56.7 kg)  Height: 5\' 3"  (1.6 m)       Latest Ref Rng & Units 06/09/2022    2:44 PM 11/08/2021    2:21 PM 06/11/2019    3:45 PM  CBC  WBC 4.0 - 10.5 K/uL 5.1  4.4  4.3   Hemoglobin 12.0 - 15.0 g/dL 16.1  09.6  04.5   Hematocrit 36.0 - 46.0 % 40.5  40.8  41.1   Platelets 150 - 400 K/uL 276  289  217.0       Latest Ref Rng & Units 06/09/2022    2:44 PM 06/11/2019     3:45 PM 12/01/2006    6:40 PM  CMP  Glucose 70 - 99 mg/dL 91  409  85   BUN 6 - 20 mg/dL 14  15  10    Creatinine 0.44 - 1.00 mg/dL 8.11  9.14  7.82  Sodium 135 - 145 mmol/L 133  139  138   Potassium 3.5 - 5.1 mmol/L 3.8  3.2  3.5   Chloride 98 - 111 mmol/L 100  103  103   CO2 22 - 32 mmol/L 25  28  24    Calcium 8.9 - 10.3 mg/dL 8.9  9.4  9.8   Total Protein 6.5 - 8.1 g/dL 7.0  7.2    Total Bilirubin 0.3 - 1.2 mg/dL 0.4  0.3    Alkaline Phos 38 - 126 U/L 50  54    AST 15 - 41 U/L 20  24    ALT 0 - 44 U/L 25  26        03/27/2023   12:00 PM  MMSE - Mini Mental State Exam  Orientation to time 4  Orientation to Place 5  Registration 3  Attention/ Calculation 5  Recall 3  Language- name 2 objects 2  Language- repeat 1  Language- follow 3 step command 3  Language- read & follow direction 1  Write a sentence 1  Copy design 1  Total score 29       GEN:  The patient appears stated age and is in NAD. HEENT:  Normocephalic, atraumatic.  The mucous membranes are moist. The superficial temporal arteries are without ropiness or tenderness. CV:  RRR Lungs:  CTAB Neck/HEME:  There are no carotid bruits bilaterally.  Neurological examination:  Orientation: The patient is alert and oriented x3. Cranial nerves: Facial symmetry is noted. tThe speech is fluent and clear. Soft palate rises symmetrically and there is no tongue deviation. Hearing is intact to conversational tone. Sensation: Sensation is intact to light touch throughout, symmetric.  There is no extinction.   Motor: Strength is 5/5 in the upper and lower extremities. DTRs: 2/4 throughout.  Plantar responses are neutral bilaterally. Gait and Station: She ambulates with normal arm swing, gait cautious and narrow, stride is normal.    Movement examination: Tone: There is normal tone in the UE/LE.  No cogwheeling is noted. Abnormal movements: Mild postural and on intention left greater than right    I have reviewed and  interpreted the following labs independently   Total time spent on today's visit was49 minutes, including both face-to-face time and nonface-to-face time.  Time included that spent on review of records (prior notes available to me/labs/imaging if pertinent), discussing treatment and goals, answering patient's questions and coordinating care.   Cc:  Gweneth Dimitri, MD

## 2023-03-30 ENCOUNTER — Telehealth: Payer: Self-pay | Admitting: Neurology

## 2023-03-30 NOTE — Telephone Encounter (Signed)
Will place new referral.

## 2023-03-30 NOTE — Telephone Encounter (Signed)
Pls let patient know that [facility] declined the referral. We will send the referral for second opinion to [facility] (pls send referral for Dx: abnormal brain MRI). Pls let patient know per Dr. Arbutus Leas, no need to f/u with her next year, would do the [facility] evaluation. Thanks

## 2023-03-30 NOTE — Telephone Encounter (Signed)
Nikki with [facility] called about a referral that Sara/Tat sent to them. They were wanting to rule out MS but Dr.Sater said it is not MS and they feel the patient doesn't need to be seen. If tat or sara want to discuss this, they can call [phone]

## 2023-03-31 NOTE — Telephone Encounter (Signed)
Successful fax

## 2023-03-31 NOTE — Telephone Encounter (Signed)
Faxed to [facility], awaiting confirmation

## 2023-04-03 NOTE — Telephone Encounter (Signed)
I left detailed message to let her know that [facility] denied referral, new referral sent to  [facility]. If she has any questions needs to call office.

## 2023-04-19 ENCOUNTER — Telehealth: Payer: Self-pay | Admitting: Neurology

## 2023-04-19 NOTE — Telephone Encounter (Signed)
Called [facility] and they are reviewing the case and will call to schedule. Called patient and let her know

## 2023-04-19 NOTE — Telephone Encounter (Signed)
Pt called in stating she still has not heard from anyone at [facility].

## 2023-04-28 ENCOUNTER — Telehealth: Payer: Self-pay | Admitting: Neurology

## 2023-04-28 NOTE — Telephone Encounter (Signed)
Pt calling to leave message for [facility] has still not contacted her

## 2023-04-28 NOTE — Telephone Encounter (Signed)
I gave [facility] [phone] patient is going to check on status of this, chelsea confirmed they had received information, awaiting appt time. She thanked me  for calling.

## 2023-05-07 ENCOUNTER — Other Ambulatory Visit: Payer: Self-pay | Admitting: Physician Assistant

## 2023-05-09 ENCOUNTER — Telehealth: Payer: Self-pay | Admitting: Neurology

## 2023-05-09 NOTE — Telephone Encounter (Signed)
She is going to call [facility] and see if they will go ahead and send referral to neurologist. Or PCP she thanked me for calling.

## 2023-05-09 NOTE — Telephone Encounter (Signed)
Pt called in stating she wants to speak with someone about her referral. [facility] doesn't have availability and sent it to a [place] location, but that's too far for her. She says there is a IT trainer location for [facility] with Dr. Roxanna Mew who is an MS specialist. She would like a referral to be sent there. Their phone number is [phone].

## 2023-05-11 ENCOUNTER — Ambulatory Visit: Payer: No Typology Code available for payment source | Admitting: Physician Assistant

## 2023-06-06 ENCOUNTER — Telehealth: Payer: Self-pay | Admitting: Physician Assistant

## 2023-06-06 MED ORDER — FAMOTIDINE 40 MG PO TABS: 40.0000 mg | ORAL_TABLET | Freq: Two times a day (BID) | ORAL | 1 refills | Status: DC

## 2023-06-06 MED ORDER — OMEPRAZOLE 40 MG PO CPDR: 40.0000 mg | DELAYED_RELEASE_CAPSULE | Freq: Two times a day (BID) | ORAL | 3 refills | Status: DC

## 2023-06-06 NOTE — Telephone Encounter (Signed)
Scripts sent to the pharmacy 

## 2023-06-06 NOTE — Telephone Encounter (Signed)
Inbound call from patient, would like refill for prolisec and also pepcid called into [facility]

## 2023-06-22 ENCOUNTER — Other Ambulatory Visit: Payer: Self-pay | Admitting: Neurology

## 2023-06-22 DIAGNOSIS — H34231 Retinal artery branch occlusion, right eye: Secondary | ICD-10-CM

## 2023-06-26 ENCOUNTER — Ambulatory Visit
Payer: No Typology Code available for payment source | Attending: Cardiovascular Disease | Admitting: Cardiovascular Disease

## 2023-06-26 ENCOUNTER — Encounter: Payer: Self-pay | Admitting: Cardiovascular Disease

## 2023-06-26 VITALS — BP 102/80 | HR 82 | Ht 63.0 in | Wt 120.0 lb

## 2023-06-26 DIAGNOSIS — Z0000 Encounter for general adult medical examination without abnormal findings: Secondary | ICD-10-CM | POA: Diagnosis not present

## 2023-06-26 DIAGNOSIS — H349 Unspecified retinal vascular occlusion: Secondary | ICD-10-CM | POA: Diagnosis not present

## 2023-06-26 NOTE — Patient Instructions (Signed)
Medication Instructions:  No changes *If you need a refill on your cardiac medications before your next appointment, please call your pharmacy*   Lab Work: none   Testing/Procedures:  ECHO W BUBBLE STUDY Your physician has requested that you have an echocardiogram. Echocardiography is a painless test that uses sound waves to create images of your heart. It provides your doctor with information about the size and shape of your heart and how well your heart's chambers and valves are working. This procedure takes approximately one hour. There are no restrictions for this procedure. Please do NOT wear cologne, perfume, aftershave, or lotions (deodorant is allowed). Please arrive 15 minutes prior to your appointment time.  Please note: We ask at that you not bring children with you during ultrasound (echo/ vascular) testing. Due to room size and safety concerns, children are not allowed in the ultrasound rooms during exams. Our front office staff cannot provide observation of children in our lobby area while testing is being conducted. An adult accompanying a patient to their appointment will only be allowed in the ultrasound room at the discretion of the ultrasound technician under special circumstances. We apologize for any inconvenience.   Follow-Up: At [facility], you and your health needs are our priority.  As part of our continuing mission to provide you with exceptional heart care, we have created designated Provider Care Teams.  These Care Teams include your primary Cardiologist (physician) and Advanced Practice Providers (APPs -  Physician Assistants and Nurse Practitioners) who all work together to provide you with the care you need, when you need it.   Your next appointment:   4-6 week(s)  Provider:   Verne Carrow, MD       1st Floor: - Lobby - Registration  - Pharmacy  - Lab - Cafe  2nd Floor: - PV Lab - Diagnostic Testing (echo, CT, nuclear med)  3rd  Floor: - Vacant  4th Floor: - TCTS (cardiothoracic surgery) - AFib Clinic - Structural Heart Clinic - Vascular Surgery  - Vascular Ultrasound  5th Floor: - HeartCare Cardiology (general and EP) - Clinical Pharmacy for coumadin, hypertension, lipid, weight-loss medications, and med management appointments    Valet parking services will be available as well.

## 2023-06-26 NOTE — Progress Notes (Signed)
Chief Complaint  Patient presents with   New Patient (Initial Visit)    Ocular stroke.    History of Present Illness: 53 yo female with history of anxiety, GERD, ocular stroke and RA who is here today as a new patient, self referral, for cardiac evaluation following ocular stroke. She tells me that she began having dizziness and headaches in December 2023. She developed right sided facial droop and was seen in the ED at [facility] on 06/09/22. Brain MRI without evidence of stroke. She was seen by Neurology and was discharged home. She stopped estrogen and Vyvanse after that event. She was seen by her ophthalmologist in February 2024 and was told that she had the appearance of an ocular arterial occlusion. She saw a Neurologist in [place] recently who was an MS specialist. He recommended an echo and repeat CT head and neck. She has the CT planned for next week. She feels overall. No exertional chest pain or dyspnea. Rare palpitations. 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 She is the daughter of Beckey Rutter, my former patient who has passes away. Her aunt is Darel Hong Daughtry who is also my patient.   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    Past Surgical History:  Procedure Laterality Date   BRAVO [facility] STUDY N/A 08/15/2019   Procedure: BRAVO PH STUDY;  Surgeon: Rachael Fee, MD;  Location: [facility];  Service: Endoscopy;  Laterality: N/A;   CHOLECYSTECTOMY     ESOPHAGOGASTRODUODENOSCOPY (EGD) WITH PROPOFOL N/A 08/15/2019   Procedure: ESOPHAGOGASTRODUODENOSCOPY (EGD) WITH PROPOFOL;  Surgeon: Rachael Fee, MD;  Location: [facility];  Service: Endoscopy;  Laterality: N/A;   INTRAUTERINE DEVICE  (IUD) INSERTION     mirena insertion 07-05-18   ovarian tumor     benign   TONSILLECTOMY      Current Outpatient Medications  Medication Sig Dispense Refill   ASPIRIN 81 PO Take by mouth.     baclofen (LIORESAL) 10 MG tablet Take 10 mg by mouth daily as needed (headaches).     cetirizine (ZYRTEC) 10 MG tablet Take 10 mg by mouth at bedtime.      cyanocobalamin (VITAMIN B12) 1000 MCG/ML injection Inject 1,000 mcg into the muscle every 14 (fourteen) days.     ENBREL SURECLICK 50 MG/ML injection Inject into the skin.     famotidine (PEPCID) 40 MG tablet Take 1 tablet (40 mg total) by mouth in the morning and at bedtime. 180 tablet 1   fexofenadine (ALLEGRA) 180 MG tablet Take 180 mg by mouth daily.     FLUoxetine (PROZAC) 10 MG capsule Take 20 mg by mouth daily.     levonorgestrel (MIRENA) 20 MCG/24HR IUD 1 each by Intrauterine route once.     omeprazole (PRILOSEC) 40 MG capsule Take 1 capsule (40 mg total) by mouth 2 (two) times daily before a meal. 180 capsule 3   ondansetron (ZOFRAN-ODT) 4 MG disintegrating tablet Take 4 mg by mouth every 6 (six) hours as needed.     primidone (MYSOLINE) 250 MG tablet Take 1 tablet (250 mg total) by mouth daily. 90 tablet 3  propranolol (INDERAL) 20 MG tablet Take 20 mg by mouth 2 (two) times daily.     Vitamin D, Ergocalciferol, (DRISDOL) 1.25 MG (50000 UNIT) CAPS capsule Take by mouth.     ZEPBOUND 15 MG/0.5ML Pen Inject 2 mg into the skin once a week.     zonisamide (ZONEGRAN) 25 MG capsule Take 75 mg by mouth at bedtime. For mirgraines     sucralfate (CARAFATE) 1 g tablet Take 1 g by mouth as needed. (Patient not taking: Reported on 06/26/2023)     No current facility-administered medications for this visit.    Allergies  Allergen Reactions   Azithromycin Other (See Comments)   Citalopram Hydrobromide Other (See Comments)   Diclofenac Sodium Other (See Comments)   Estrogens Other (See Comments)    Blood clot to right eye   Etodolac Other (See  Comments)   Nalbuphine Other (See Comments)   Nsaids Other (See Comments)    Burns stomach    Ciprofloxacin Rash   Codeine Rash   Diclofenac Sodium Rash   Doxycycline Hyclate     Upset stomach   Erythromycin Other (See Comments)    Upset stomach   Levaquin [Levofloxacin In D5w] Rash   Nubain [Nalbuphine Hcl] Rash   Penicillins Rash    Did it involve swelling of the face/tongue/throat, SOB, or low BP? No Did it involve sudden or severe rash/hives, skin peeling, or any reaction on the inside of your mouth or nose? Yes Did you need to seek medical attention at a hospital or doctor's office? Yes When did it last happen?      20 Years If all above answers are "NO", may proceed with cephalosporin use.     Social History   Socioeconomic History   Marital status: Married    Spouse name: Not on file   Number of children: 2   Years of education: Not on file   Highest education level: Not on file  Occupational History   Occupation: Interior and spatial designer Account management- [employer]  Tobacco Use   Smoking status: Never   Smokeless tobacco: Never  Vaping Use   Vaping status: Never Used  Substance and Sexual Activity   Alcohol use: Yes    Alcohol/week: 3.0 standard drinks of alcohol    Types: 3 Glasses of wine per week    Comment: 3 GLASSES A WEEK    Drug use: No   Sexual activity: Yes    Partners: Male    Birth control/protection: I.U.D.    Comment: 1st intercourse- 18, partners- 3  Other Topics Concern   Not on file  Social History Narrative   Left handed    Drinks caffeine   Lives with husband and son    Social Drivers of Corporate investment banker Strain: Not on file  Food Insecurity: Not on file  Transportation Needs: Not on file  Physical Activity: Not on file  Stress: Not on file  Social Connections: Unknown (09/20/2022)   Received from [facility]   Social Network    Social Network: Not on file  Intimate Partner Violence: Unknown (09/20/2022)   Received from  [facility]   HITS    Physically Hurt: Not on file    Insult or Talk Down To: Not on file    Threaten Physical Harm: Not on file    Scream or Curse: Not on file    Family History  Problem Relation Age of Onset   Rheumatologic disease Father  Tremor Father    Colon polyps Father    Stroke Father    Atrial fibrillation Father    Other Sister        covid pneumonia- died from this   Heart attack Sister    Liver disease Sister        NASH   Colon cancer Paternal Grandmother        died 74   Heart attack Paternal Grandfather    Depression Daughter    ADD / ADHD Son    Other Son        dyslexia   Colon cancer Maternal Uncle        died 8   Colon polyps Paternal Aunt     Review of Systems:  As stated in the HPI and otherwise negative.   BP 102/80   Pulse 82   Ht 5\' 3"  (1.6 m)   Wt 54.4 kg   SpO2 99%   BMI 21.26 kg/m   Physical Examination: General: Well developed, well nourished, NAD  HEENT: OP clear, mucus membranes moist  SKIN: warm, dry. No rashes. Neuro: No focal deficits  Musculoskeletal: Muscle strength 5/5 all ext  Psychiatric: Mood and affect normal  Neck: No JVD, no carotid bruits, no thyromegaly, no lymphadenopathy.  Lungs:Clear bilaterally, no wheezes, rhonci, crackles Cardiovascular: Regular rate and rhythm. No murmurs, gallops or rubs. Abdomen:Soft. Bowel sounds present. Non-tender.  Extremities: No lower extremity edema. Pulses are 2 + in the bilateral DP/PT.  EKG:  EKG is ordered today. The ekg ordered today demonstrates  EKG Interpretation Date/Time:  Monday June 26 2023 15:16:53 EST Ventricular Rate:  69 PR Interval:  146 QRS Duration:  74 QT Interval:  382 QTC Calculation: 409 R Axis:   49  Text Interpretation: Normal sinus rhythm Normal ECG Confirmed by Verne Carrow [phone]) on 06/26/2023 3:18:44 PM   Recent Labs: No results found for requested labs within last 365 days.   Lipid Panel No results found  for: "CHOL", "TRIG", "HDL", "CHOLHDL", "VLDL", "LDLCALC", "LDLDIRECT"   Wt Readings from Last 3 Encounters:  06/26/23 54.4 kg  03/27/23 56.7 kg  02/08/23 59.4 kg    Assessment and Plan:   1. Ocular stroke: Unclear etiology. I will arrange an echo with bubble study to exclude atrial septal defect. We discussed arranging a cardiac monitor but she only has rare palpitations for a few seconds associated with hot flashes. She does not wish to wear a cardiac monitor. Recent normal thyroid studies. She is discussing starting a statin with her other physician.   Labs/ tests ordered today include:  Orders Placed This Encounter  Procedures   EKG 12-Lead   ECHOCARDIOGRAM COMPLETE BUBBLE STUDY   Disposition:   F/U with me in 4-6 weeks  Signed, Verne Carrow, MD, [facility] 06/26/2023 4:03 PM    [facility] [address] [place], [place], [place]  [address] Phone: [phone]; Fax: [phone]

## 2023-06-29 ENCOUNTER — Ambulatory Visit
Admission: RE | Admit: 2023-06-29 | Discharge: 2023-06-29 | Disposition: A | Discharge status: Home / Self Care | Payer: No Typology Code available for payment source | Source: Ambulatory Visit | Attending: Neurology | Admitting: Neurology

## 2023-06-29 DIAGNOSIS — H34231 Retinal artery branch occlusion, right eye: Secondary | ICD-10-CM

## 2023-06-29 MED ORDER — IOPAMIDOL (ISOVUE-370) INJECTION 76%
75.0000 mL | Freq: Once | INTRAVENOUS | Status: AC | PRN
  Administered 2023-06-29: 75 mL via INTRAVENOUS

## 2023-06-30 ENCOUNTER — Ambulatory Visit (HOSPITAL_COMMUNITY)
Admission: RE | Admit: 2023-06-30 | Discharge: 2023-06-30 | Disposition: A | Discharge status: Home / Self Care | Payer: No Typology Code available for payment source | Source: Ambulatory Visit | Attending: Cardiovascular Disease | Admitting: Cardiovascular Disease

## 2023-06-30 DIAGNOSIS — I10 Essential (primary) hypertension: Secondary | ICD-10-CM | POA: Insufficient documentation

## 2023-06-30 DIAGNOSIS — H349 Unspecified retinal vascular occlusion: Secondary | ICD-10-CM | POA: Diagnosis present

## 2023-06-30 LAB — ECHOCARDIOGRAM COMPLETE BUBBLE STUDY
AR max vel: 2.38 cm2
AV Area VTI: 2.42 cm2
AV Area mean vel: 2.17 cm2
AV Mean grad: 3 mm[Hg]
AV Peak grad: 6.5 mm[Hg]
Ao pk vel: 1.27 m/s
Area-P 1/2: 4.74 cm2
Calc EF: 53.4 %
MV VTI: 2.2 cm2
S' Lateral: 2.7 cm
Single Plane A2C EF: 53.8 %
Single Plane A4C EF: 52.7 %

## 2023-06-30 NOTE — Progress Notes (Signed)
*  PRELIMINARY RESULTS* Echocardiogram 2D Echocardiogram has been performed.  [name] 06/30/2023, 10:54 AM

## 2023-07-07 ENCOUNTER — Telehealth: Payer: Self-pay | Admitting: Cardiovascular Disease

## 2023-07-07 NOTE — Telephone Encounter (Signed)
[facility] said they sent order over for TEE for this patient. Please advise

## 2023-07-14 ENCOUNTER — Other Ambulatory Visit (HOSPITAL_COMMUNITY): Payer: No Typology Code available for payment source

## 2023-07-31 ENCOUNTER — Ambulatory Visit: Payer: No Typology Code available for payment source | Admitting: Cardiovascular Disease

## 2023-07-31 NOTE — Progress Notes (Deleted)
 No chief complaint on file.  History of Present Illness: 53 yo female with history of anxiety, GERD, ocular stroke and RA who is here today for follow up. I saw her as a new patient in January 2025 for cardiac evaluation following ocular stroke. She began having dizziness and headaches in December 2023. She developed right sided facial droop and was seen in the ED at [facility] on 06/09/22. Brain MRI without evidence of stroke. She was seen by Neurology and was discharged home. She stopped estrogen and Vyvanse after that event. She was seen by her ophthalmologist in February 2024 and was told that she had the appearance of an ocular arterial occlusion. She saw a Neurologist in [place] recently who was an MS specialist. He recommended an echo and repeat CT head and neck. Echo 06/30/23 with LVEF=55-60%.  Normal RV function. No valve disease. She was seen by *** and TEE recommended.   She is here today for follow up. The patient denies any chest pain, dyspnea, palpitations, lower extremity edema, orthopnea, PND, dizziness, near syncope or syncope.   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 She is the daughter of Beckey Rutter, my former patient who has passes away. Her aunt is Darel Hong Daughtry who is also my patient.   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    Past Surgical History:  Procedure Laterality Date   BRAVO [facility] STUDY N/A 08/15/2019   Procedure: BRAVO PH STUDY;  Surgeon: Rachael Fee, MD;  [facility];  Service: Endoscopy;  Laterality: N/A;   CHOLECYSTECTOMY     ESOPHAGOGASTRODUODENOSCOPY (EGD) WITH PROPOFOL N/A 08/15/2019   Procedure: ESOPHAGOGASTRODUODENOSCOPY (EGD) WITH PROPOFOL;   Surgeon: Rachael Fee, MD;  [facility]: [facility];  Service: Endoscopy;  Laterality: N/A;   INTRAUTERINE DEVICE (IUD) INSERTION     mirena insertion 07-05-18   ovarian tumor     benign   TONSILLECTOMY      Current Outpatient Medications  Medication Sig Dispense Refill   ASPIRIN 81 PO Take by mouth.     baclofen (LIORESAL) 10 MG tablet Take 10 mg by mouth daily as needed (headaches).     cetirizine (ZYRTEC) 10 MG tablet Take 10 mg by mouth at bedtime.      cyanocobalamin (VITAMIN B12) 1000 MCG/ML injection Inject 1,000 mcg into the muscle every 14 (fourteen) days.     ENBREL SURECLICK 50 MG/ML injection Inject into the skin.     famotidine (PEPCID) 40 MG tablet Take 1 tablet (40 mg total) by mouth in the morning and at bedtime. 180 tablet 1   fexofenadine (ALLEGRA) 180 MG tablet Take 180 mg by mouth daily.     FLUoxetine (PROZAC) 10 MG capsule Take 20 mg by mouth daily.     levonorgestrel (MIRENA) 20 MCG/24HR IUD 1 each by Intrauterine route once.     omeprazole (PRILOSEC) 40 MG capsule Take 1 capsule (40 mg total) by mouth 2 (two) times daily before a meal. 180 capsule 3   ondansetron (ZOFRAN-ODT) 4 MG disintegrating tablet Take 4 mg by mouth every 6 (six) hours as needed.  primidone (MYSOLINE) 250 MG tablet Take 1 tablet (250 mg total) by mouth daily. 90 tablet 3   propranolol (INDERAL) 20 MG tablet Take 20 mg by mouth 2 (two) times daily.     sucralfate (CARAFATE) 1 g tablet Take 1 g by mouth as needed. (Patient not taking: Reported on 06/26/2023)     Vitamin D, Ergocalciferol, (DRISDOL) 1.25 MG (50000 UNIT) CAPS capsule Take by mouth.     ZEPBOUND 15 MG/0.5ML Pen Inject 2 mg into the skin once a week.     zonisamide (ZONEGRAN) 25 MG capsule Take 75 mg by mouth at bedtime. For mirgraines     No current facility-administered medications for this visit.    Allergies  Allergen Reactions   Azithromycin Other (See Comments)   Citalopram Hydrobromide Other (See Comments)    Diclofenac Sodium Other (See Comments)   Estrogens Other (See Comments)    Blood clot to right eye   Etodolac Other (See Comments)   Nalbuphine Other (See Comments)   Nsaids Other (See Comments)    Burns stomach    Ciprofloxacin Rash   Codeine Rash   Diclofenac Sodium Rash   Doxycycline Hyclate     Upset stomach   Erythromycin Other (See Comments)    Upset stomach   Levaquin [Levofloxacin In D5w] Rash   Nubain [Nalbuphine Hcl] Rash   Penicillins Rash    Did it involve swelling of the face/tongue/throat, SOB, or low BP? No Did it involve sudden or severe rash/hives, skin peeling, or any reaction on the inside of your mouth or nose? Yes Did you need to seek medical attention at a hospital or doctor's office? Yes When did it last happen?      20 Years If all above answers are "NO", may proceed with cephalosporin use.     Social History   Socioeconomic History   Marital status: Married    Spouse name: Not on file   Number of children: 2   Years of education: Not on file   Highest education level: Not on file  Occupational History   Occupation: Interior and spatial designer Account management- [employer]  Tobacco Use   Smoking status: Never   Smokeless tobacco: Never  Vaping Use   Vaping status: Never Used  Substance and Sexual Activity   Alcohol use: Yes    Alcohol/week: 3.0 standard drinks of alcohol    Types: 3 Glasses of wine per week    Comment: 3 GLASSES A WEEK    Drug use: No   Sexual activity: Yes    Partners: Male    Birth control/protection: I.U.D.    Comment: 1st intercourse- 18, partners- 3  Other Topics Concern   Not on file  Social History Narrative   Left handed    Drinks caffeine   Lives with husband and son    Social Drivers of Corporate investment banker Strain: Not on file  Food Insecurity: Not on file  Transportation Needs: Not on file  Physical Activity: Not on file  Stress: Not on file  Social Connections: Unknown (09/20/2022)   Received [facility]   Social Network    Social Network: Not on file  Intimate Partner Violence: Unknown (09/20/2022)   Received from [facility], [facility]   HITS    Physically Hurt: Not on file    Insult or Talk Down To: Not on file    Threaten Physical Harm: Not on file    Scream or Curse: Not on file  Family History  Problem Relation Age of Onset   Rheumatologic disease Father    Tremor Father    Colon polyps Father    Stroke Father    Atrial fibrillation Father    Other Sister        covid pneumonia- died from this   Heart attack Sister    Liver disease Sister        NASH   Colon cancer Paternal Grandmother        died 36   Heart attack Paternal Grandfather    Depression Daughter    ADD / ADHD Son    Other Son        dyslexia   Colon cancer Maternal Uncle        died 48   Colon polyps Paternal Aunt     Review of Systems:  As stated in the HPI and otherwise negative.   There were no vitals taken for this visit.  Physical Examination: General: Well developed, well nourished, NAD  HEENT: OP clear, mucus membranes moist  SKIN: warm, dry. No rashes. Neuro: No focal deficits  Musculoskeletal: Muscle strength 5/5 all ext  Psychiatric: Mood and affect normal  Neck: No JVD, no carotid bruits, no thyromegaly, no lymphadenopathy.  Lungs:Clear bilaterally, no wheezes, rhonci, crackles Cardiovascular: Regular rate and rhythm. No murmurs, gallops or rubs. Abdomen:Soft. Bowel sounds present. Non-tender.  Extremities: No lower extremity edema. Pulses are 2 + in the bilateral DP/PT.  EKG:  EKG is not ordered today. The ekg ordered today demonstrates   Recent Labs: No results found for requested labs within last 365 days.   Lipid Panel No results found for: "CHOL", "TRIG", "HDL", "CHOLHDL", "VLDL", "LDLCALC", "LDLDIRECT"   Wt Readings from Last 3 Encounters:  06/26/23 54.4 kg  03/27/23 56.7 kg  02/08/23 59.4 kg    Assessment and Plan:   1. Ocular stroke: Unclear  etiology. Surface echo with normal LV function and no evidence of valvular disease or ASD/PFO. We are now asked to help arrange a TEE. Will arrange a TEE at [facility] on *** Risks and benefits of the procedure are reviewed with the patient oday.    Labs/ tests ordered today include:  No orders of the defined types were placed in this encounter.  Disposition:   F/U with me in 4-6 weeks  Signed, Verne Carrow, MD, [facility] 07/31/2023 10:26 AM    [facility] [address] [place], [place], [place]  [address] Phone: [phone]; Fax: [phone]

## 2023-08-07 ENCOUNTER — Other Ambulatory Visit: Payer: Self-pay | Admitting: Family Medicine

## 2023-08-07 DIAGNOSIS — Z1231 Encounter for screening mammogram for malignant neoplasm of breast: Secondary | ICD-10-CM

## 2023-08-11 DIAGNOSIS — M25551 Pain in right hip: Secondary | ICD-10-CM | POA: Insufficient documentation

## 2023-08-19 NOTE — Progress Notes (Unsigned)
 Cardiology Office Note:  .   Date:  08/22/2023  ID:  [name], DOB [birth year], MRN [id number] PCP: Gweneth Dimitri, MD  [facility] Providers Cardiologist:  Verne Carrow, MD   History of Present Illness: [name] Kitchen   [name] is a 53 y.o. female with a past medical history of anxiety, GERD, ocular stroke, RA. Patient is followed by Dr. Clifton James and presents today for an outpatient follow up appointment   Patient was first referred to Dr. Clifton James in 06/2023 for evaluation after she had an ocular stroke in 06/2022. She had issues with dizziness and headaches in 05/2022. Went on to develop right sided facial droop and was seen in the ED at [facility] on 06/09/22. Brain MRI was without evidence of stroke. She was seen by neurology and was discharged. Her estrogen and Vyvanse were stopped. She underwent echocardiogram on 06/30/23 that showed EF 55-60%, no regional wall motion abnormalities, normal diastolic parameters, normal RV systolic function, no significant valvular abnormalities. Bubble study was negative, no evidence for PFO or ASD. Discussed wearing a cardiac monitor, but patient refused.   Patient has been seen by neurology, ophthalmology. 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 Neurology suspects ischemic event. She has been started on baby aspirin. Has been discussing statin therapy with other providers (PCP, neurology)   Today, patient presents for a follow up appointment. She previously had an ocular stroke in 06/2022. Since this event, she has been seen by multiple neurologists and ophthalmologists. There has been some debate as to what actually caused her facial droop/other symptoms, but currently it is believed that her symptoms were due to an ischemic event. Since then, she has had ongoing migraines. The migraines started in December of 2023. More recently, they have been occurring  daily. The patient reports that the migraines are not responding to trigger point injections or medications. The patient is currently on Crestor for cholesterol management and takes a baby aspirin daily. The patient also reports a history of early menopause at age 66 and was previously on estrogen and Vyvanse, which she stopped taking after the ischemic event.  ROS: Denies chest pain, shortness of breath, syncope, near syncope. Has migraines   Studies Reviewed: .   Cardiac Studies & Procedures   ______________________________________________________________________________________________     ECHOCARDIOGRAM  ECHOCARDIOGRAM COMPLETE BUBBLE STUDY 06/30/2023  Narrative ECHOCARDIOGRAM REPORT    Patient Name:   [name] [facility] Date of Exam: 06/30/2023 Medical Rec #:  [id number]               Height:       63.0 in Accession #:    [id number]              Weight:       120.0 lb Date of Birth:  Aug 31, 1970                BSA:          1.556 m Patient Age:    52 years                BP:           102/80 mmHg Patient Gender: F                       HR:           67 bpm. 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 Risk Factors:Hypertension.  Sonographer:    Neysa Bonito Roar Referring Phys: [id number] CHRISTOPHER D MCALHANY  IMPRESSIONS   1. Left ventricular ejection fraction, by estimation, is 55 to 60%. The left ventricle has normal function. The left ventricle has no regional wall motion abnormalities. Left ventricular diastolic parameters were normal. 2. Right ventricular systolic function is normal. The right ventricular size is normal. Tricuspid regurgitation signal is inadequate for assessing PA pressure. 3. The mitral valve is normal in structure. No evidence of mitral valve regurgitation. No  evidence of mitral stenosis. 4. The aortic valve is tricuspid. Aortic valve regurgitation is not visualized. No aortic stenosis is present. 5. The inferior vena cava is normal in size with greater than 50% respiratory variability, suggesting right atrial pressure of 3 mmHg. 6. Negative bubble study, no evidence for PFO or ASD.  FINDINGS Left Ventricle: Left ventricular ejection fraction, by estimation, is 55 to 60%. The left ventricle has normal function. The left ventricle has no regional wall motion abnormalities. The left ventricular internal cavity size was normal in size. There is no left ventricular hypertrophy. Left ventricular diastolic parameters were normal.  Right Ventricle: The right ventricular size is normal. No increase in right ventricular wall thickness. Right ventricular systolic function is normal. Tricuspid regurgitation signal is inadequate for assessing PA pressure.  Left Atrium: Left atrial size was normal in size.  Right Atrium: Right atrial size was normal in size.  Pericardium: There is no evidence of pericardial effusion.  Mitral Valve: The mitral valve is normal in structure. No evidence of mitral valve regurgitation. No evidence of mitral valve stenosis. MV peak gradient, 4.0 mmHg. The mean mitral valve gradient is 2.0 mmHg.  Tricuspid Valve: The tricuspid valve is normal in structure. Tricuspid valve regurgitation is not demonstrated.  Aortic Valve: The aortic valve is tricuspid. Aortic valve regurgitation is not visualized. No aortic stenosis is present. Aortic valve mean gradient measures 3.0 mmHg. Aortic valve peak gradient measures 6.5 mmHg. Aortic valve area, by VTI measures 2.42 cm.  Pulmonic Valve: The pulmonic valve was normal in structure. Pulmonic valve regurgitation is not visualized.  Aorta: The aortic root is normal in size and structure.  Venous: The inferior vena cava is normal in size with greater than 50% respiratory variability, suggesting  right atrial pressure of 3 mmHg.  IAS/Shunts: Negative bubble study, no evidence for PFO or ASD. Agitated saline contrast was given intravenously to evaluate for intracardiac shunting.   LEFT VENTRICLE PLAX 2D LVIDd:         3.70 cm     Diastology LVIDs:         2.70 cm     LV e' medial:    11.20 cm/s LV PW:         0.80 cm     LV E/e' medial:  10.1 LV IVS:        0.80 cm     LV e' lateral:   16.60 cm/s LVOT diam:     1.70 cm     LV E/e' lateral: 6.8 LV SV:         59 LV SV Index:   38 LVOT Area:     2.27 cm  LV Volumes (MOD) LV vol d, MOD A2C: 39.4 ml LV vol d, MOD A4C: 45.7 ml LV vol s, MOD  A2C: 18.2 ml LV vol s, MOD A4C: 21.6 ml LV SV MOD A2C:     21.2 ml LV SV MOD A4C:     45.7 ml LV SV MOD BP:      22.6 ml  RIGHT VENTRICLE RV Basal diam:  3.00 cm RV Mid diam:    2.60 cm RV S prime:     12.00 cm/s TAPSE (M-mode): 2.3 cm  LEFT ATRIUM             Index        RIGHT ATRIUM           Index LA diam:        3.10 cm 1.99 cm/m   RA Area:     10.80 cm LA Vol (A2C):   31.9 ml 20.50 ml/m  RA Volume:   23.30 ml  14.97 ml/m LA Vol (A4C):   20.3 ml 13.04 ml/m LA Biplane Vol: 27.2 ml 17.48 ml/m AORTIC VALVE                    PULMONIC VALVE AV Area (Vmax):    2.38 cm     PV Vmax:        0.93 m/s AV Area (Vmean):   2.17 cm     PV Peak grad:   3.4 mmHg AV Area (VTI):     2.42 cm     RVOT Peak grad: 2 mmHg AV Vmax:           127.00 cm/s AV Vmean:          86.900 cm/s AV VTI:            0.245 m AV Peak Grad:      6.5 mmHg AV Mean Grad:      3.0 mmHg LVOT Vmax:         133.00 cm/s LVOT Vmean:        83.000 cm/s LVOT VTI:          0.261 m LVOT/AV VTI ratio: 1.07  AORTA Ao Sinus diam: 2.20 cm Ao STJ diam:   2.1 cm Ao Asc diam:   2.80 cm  MITRAL VALVE MV Area (PHT): 4.74 cm     SHUNTS MV Area VTI:   2.20 cm     Systemic VTI:  0.26 m MV Peak grad:  4.0 mmHg     Systemic Diam: 1.70 cm MV Mean grad:  2.0 mmHg MV Vmax:       1.00 m/s MV Vmean:      63.9 cm/s MV  Decel Time: 160 msec MV E velocity: 113.00 cm/s MV A velocity: 68.70 cm/s MV E/A ratio:  1.64  Dalton McleanMD Electronically signed by Wilfred Lacy Signature Date/Time: 06/30/2023/10:58:57 AM    Final          ______________________________________________________________________________________________      Risk Assessment/Calculations:             Physical Exam:   VS:  BP 118/72   Pulse 78   Ht 5\' 3"  (1.6 m)   Wt 114 lb 12.8 oz (52.1 kg)   SpO2 98%   BMI 20.34 kg/m    Wt Readings from Last 3 Encounters:  08/22/23 114 lb 12.8 oz (52.1 kg)  06/26/23 120 lb (54.4 kg)  03/27/23 125 lb (56.7 kg)    GEN: Well nourished, well developed in no acute distress. Sitting comfortably on the exam table  NECK: No JVD CARDIAC: RRR, no murmurs, rubs, gallops. Radial pulses  2+ bilaterally  RESPIRATORY:  Clear to auscultation without rales, wheezing or rhonchi. Normal WOB on room air   ABDOMEN: Soft, non-tender, non-distended EXTREMITIES:  No edema in BLE; No deformity   ASSESSMENT AND PLAN: .    Ocular Stroke  - Patient had an ocular stroke in 06/2022. Neurology requesting TEE  - Carotid ultrasounds in 07/2022 showed no evidence of stenosis in bilateral ICAs. CTA head neck in 06/2023 showed minimal plaque in the ICA bulb, no stenosis. Normal cervical ICAs bilaterally  - Echocardiogram from 06/2023 showed EF 55-60%, no wall motion abnormalities, no PFO or ASD. Neurology requesting TEE  - Discussed indications for TEE, risks, and benefits. Patient is willing to proceed. We have scheduled TEE for 3/28 - Ordered BMP, CBC to be collected prior to TEE  - Instructed patient to hold zepbound for 1 week prior to TEE  - Discussed cardiac monitor with her history of CVA, but patient refuses at this time  - Continue ASA 81 mg daily  - Lipid panel from 01/2023 showed LDL 162. Patient is now on crestor 5 mg daily per neurology    Informed Consent   Shared Decision Making/Informed  Consent{    The risks [esophageal damage, perforation (1:10,000 risk), bleeding, pharyngeal hematoma as well as other potential complications associated with conscious sedation including aspiration, arrhythmia, respiratory failure and death], benefits (treatment guidance and diagnostic support) and alternatives of a transesophageal echocardiogram were discussed in detail with Ms. Wanninger and she is willing to proceed.       Dispo: Follow up in 1 year with Dr. Clifton James   Signed, Jonita Albee, PA-C

## 2023-08-21 ENCOUNTER — Ambulatory Visit
Admission: RE | Admit: 2023-08-21 | Discharge: 2023-08-21 | Disposition: A | Discharge status: Home / Self Care | Source: Ambulatory Visit | Attending: Family Medicine | Admitting: Family Medicine

## 2023-08-21 DIAGNOSIS — Z1231 Encounter for screening mammogram for malignant neoplasm of breast: Secondary | ICD-10-CM

## 2023-08-22 ENCOUNTER — Encounter: Payer: Self-pay | Admitting: Cardiology

## 2023-08-22 ENCOUNTER — Ambulatory Visit: Payer: No Typology Code available for payment source | Attending: Cardiology | Admitting: Cardiology

## 2023-08-22 ENCOUNTER — Telehealth: Payer: Self-pay

## 2023-08-22 VITALS — BP 118/72 | HR 78 | Ht 63.0 in | Wt 114.8 lb

## 2023-08-22 DIAGNOSIS — H349 Unspecified retinal vascular occlusion: Secondary | ICD-10-CM

## 2023-08-22 NOTE — Telephone Encounter (Signed)
 Called patient to schedule them for TEE

## 2023-08-22 NOTE — Patient Instructions (Signed)
 Medication Instructions:  No changes *If you need a refill on your cardiac medications before your next appointment, please call your pharmacy*  Lab Work: Today we are going to draw a Bmet and CBC If you have labs (blood work) drawn today and your tests are completely normal, you will receive your results only by: MyChart Message (if you have MyChart) OR A paper copy in the mail If you have any lab test that is abnormal or we need to change your treatment, we will call you to review the results.  Testing/Procedures:     Dear [name]  You are scheduled for a TEE (Transesophageal Echocardiogram) on Friday, March 28 with Dr. Jens Som.  Please arrive at the [facility] (Main Entrance A) at [facility]: [address] [place], [place] [address] at 7:00 AM (This time is 1 hour(s) before your procedure to ensure your preparation).   Free valet parking service is available. You will check in at ADMITTING.   *Please Note: You will receive a call the day before your procedure to confirm the appointment time. That time may have changed from the original time based on the schedule for that day.*    DIET:  Nothing to eat or drink after midnight except a sip of water with medications (see medication instructions below)  MEDICATION INSTRUCTIONS: !!IF ANY NEW MEDICATIONS ARE STARTED AFTER TODAY, PLEASE NOTIFY YOUR PROVIDER AS SOON AS POSSIBLE!!  FYI: Medications such as Semaglutide (Ozempic, [place]), Tirzepatide (Mounjaro, Zepbound), Dulaglutide (Trulicity), etc ("GLP1 agonists") AND Canagliflozin (Invokana), Dapagliflozin (Farxiga), Empagliflozin (Jardiance), Ertugliflozin (Steglatro), Bexagliflozin [employer]) or any combination with one of these drugs such as Invokamet (Canagliflozin/Metformin), Synjardy (Empagliflozin/Metformin), etc ("SGLT2 inhibitors") must be held around the time of a procedure. This is not a comprehensive list of all of these drugs. Please review all of your  medications and talk to your provider if you take any one of these. If you are not sure, ask your provider.  {TIP  The patient is currently taking a GLP-1 agonist for diabetes and or weight loss. All GLP-1 Agonists must be held for procedures using anesthesia. They must be held for as many days as they are dosed. If taken 1 time a week, it must be held for 1 week. If taken once daily it is held for 1 day, etc. This is a current list of the patient's medications for diabetes for reference  Diabetic Medications as of 08/22/2023           ZEPBOUND 15 MG/0.5ML Pen (Taking) Inject 2 mg into the skin once a week.      HOLD: Tirzepatide (Mounjaro, Zepbound) for 7 week prior to the procedure. Last dose on Tuesday, March 18.  LABS:Come to the lab at [facility] at [address] between the hours of 8:00 am and 4:30 pm. You do NOT have to be fasting.  FYI:  For your safety, and to allow [place] to monitor your vital signs accurately during the surgery/procedure we request: If you have artificial nails, gel coating, SNS etc, please have those removed prior to your surgery/procedure. Not having the nail coverings /polish removed may result in cancellation or delay of your surgery/procedure.  Your support person will be asked to wait in the waiting room during your procedure.  It is OK to have someone drop you off and come back when you are ready to be discharged.  You cannot drive after the procedure and will need someone to drive you home.  Bring your insurance cards.  *Special Note: Every effort is made to have your procedure done on time. Occasionally there are emergencies that occur at the hospital that may cause delays. Please be patient if a delay does occur.   Follow-Up: At [facility], you and your health needs are our priority.  As part of our continuing mission to provide you with exceptional heart care, we have created designated Provider Care Teams.  These Care Teams  include your primary Cardiologist (physician) and Advanced Practice Providers (APPs -  Physician Assistants and Nurse Practitioners) who all work together to provide you with the care you need, when you need it.  We recommend signing up for the patient portal called "MyChart".  Sign up information is provided on this After Visit Summary.  MyChart is used to connect with patients for Virtual Visits (Telemedicine).  Patients are able to view lab/test results, encounter notes, upcoming appointments, etc.  Non-urgent messages can be sent to your provider as well.   To learn more about what you can do with MyChart, go to [email or web address].    Your next appointment:   1 year(s)  Provider:   Verne Carrow, MD  or Robet Leu, PA-C   Other Instructions

## 2023-08-23 LAB — BASIC METABOLIC PANEL
BUN/Creatinine Ratio: 21 (ref 9–23)
BUN: 14 mg/dL (ref 6–24)
CO2: 22 mmol/L (ref 20–29)
Calcium: 9.2 mg/dL (ref 8.7–10.2)
Chloride: 104 mmol/L (ref 96–106)
Creatinine, Ser: 0.67 mg/dL (ref 0.57–1.00)
Glucose: 86 mg/dL (ref 70–99)
Potassium: 3.9 mmol/L (ref 3.5–5.2)
Sodium: 141 mmol/L (ref 134–144)
eGFR: 106 mL/min/{1.73_m2} (ref >59)

## 2023-08-23 LAB — CBC
Hematocrit: 37.6 % (ref 34.0–46.6)
Hemoglobin: 12.9 g/dL (ref 11.1–15.9)
MCH: 31.2 pg (ref 26.6–33.0)
MCHC: 34.3 g/dL (ref 31.5–35.7)
MCV: 91 fL (ref 79–97)
Platelets: 275 10*3/uL (ref 150–450)
RBC: 4.14 x10E6/uL (ref 3.77–5.28)
RDW: 13 % (ref 11.7–15.4)
WBC: 4 10*3/uL (ref 3.4–10.8)

## 2023-08-31 NOTE — Progress Notes (Signed)
Unable to reach patient about procedure, but was able to leave a detailed message. Stated that the patient needed to arrive at the hospital at 0630 , remain NPO after 0000, needs to have a ride home and a responsible adult to stay with them for 24 hours after the procedure. Instructed the patient to call back if they had any questions.

## 2023-09-01 ENCOUNTER — Encounter (HOSPITAL_COMMUNITY): Payer: Self-pay | Admitting: Cardiology

## 2023-09-01 ENCOUNTER — Ambulatory Visit (HOSPITAL_COMMUNITY): Admitting: Certified Registered Nurse Anesthetist

## 2023-09-01 ENCOUNTER — Other Ambulatory Visit: Payer: Self-pay

## 2023-09-01 ENCOUNTER — Encounter (HOSPITAL_COMMUNITY)
Admission: RE | Disposition: A | Discharge status: Home / Self Care | Payer: Self-pay | Source: Home / Self Care | Attending: Cardiology

## 2023-09-01 ENCOUNTER — Ambulatory Visit (HOSPITAL_COMMUNITY)
Admission: RE | Admit: 2023-09-01 | Discharge: 2023-09-01 | Disposition: A | Discharge status: Home / Self Care | Source: Ambulatory Visit | Attending: Cardiology | Admitting: Cardiology

## 2023-09-01 ENCOUNTER — Ambulatory Visit (HOSPITAL_COMMUNITY)
Admission: RE | Admit: 2023-09-01 | Discharge: 2023-09-01 | Disposition: A | Discharge status: Home / Self Care | Attending: Cardiology | Admitting: Cardiology

## 2023-09-01 DIAGNOSIS — I10 Essential (primary) hypertension: Secondary | ICD-10-CM | POA: Diagnosis not present

## 2023-09-01 DIAGNOSIS — Z8673 Personal history of transient ischemic attack (TIA), and cerebral infarction without residual deficits: Secondary | ICD-10-CM | POA: Insufficient documentation

## 2023-09-01 DIAGNOSIS — Z79899 Other long term (current) drug therapy: Secondary | ICD-10-CM | POA: Diagnosis not present

## 2023-09-01 DIAGNOSIS — F419 Anxiety disorder, unspecified: Secondary | ICD-10-CM

## 2023-09-01 DIAGNOSIS — J45909 Unspecified asthma, uncomplicated: Secondary | ICD-10-CM

## 2023-09-01 DIAGNOSIS — M069 Rheumatoid arthritis, unspecified: Secondary | ICD-10-CM | POA: Insufficient documentation

## 2023-09-01 DIAGNOSIS — Z7982 Long term (current) use of aspirin: Secondary | ICD-10-CM | POA: Insufficient documentation

## 2023-09-01 DIAGNOSIS — K219 Gastro-esophageal reflux disease without esophagitis: Secondary | ICD-10-CM | POA: Insufficient documentation

## 2023-09-01 DIAGNOSIS — I639 Cerebral infarction, unspecified: Secondary | ICD-10-CM

## 2023-09-01 HISTORY — PX: TRANSESOPHAGEAL ECHOCARDIOGRAM (CATH LAB): EP1270

## 2023-09-01 LAB — PREGNANCY, URINE: Preg Test, Ur: NEGATIVE

## 2023-09-01 LAB — ECHO TEE

## 2023-09-01 SURGERY — TRANSESOPHAGEAL ECHOCARDIOGRAM (TEE) (CATHLAB)
Anesthesia: Monitor Anesthesia Care

## 2023-09-01 MED ORDER — SODIUM CHLORIDE 0.9 % IV SOLN: INTRAVENOUS | Status: DC | PRN

## 2023-09-01 MED ORDER — SODIUM CHLORIDE 0.9% FLUSH: 3.0000 mL | INTRAVENOUS | Status: DC | PRN

## 2023-09-01 MED ORDER — PROPOFOL 500 MG/50ML IV EMUL
INTRAVENOUS | Status: DC | PRN
  Administered 2023-09-01: 20 mg via INTRAVENOUS
  Administered 2023-09-01 (×2): 50 mg via INTRAVENOUS

## 2023-09-01 MED ORDER — SODIUM CHLORIDE 0.9% FLUSH: 3.0000 mL | Freq: Two times a day (BID) | INTRAVENOUS | Status: DC

## 2023-09-01 MED ORDER — PROPOFOL 500 MG/50ML IV EMUL: INTRAVENOUS | Status: DC | PRN

## 2023-09-01 NOTE — Progress Notes (Signed)
     Transesophageal Echocardiogram Note  [name] [id number] 27-Dec-1970  Procedure: Transesophageal Echocardiogram Indications: CVA  Procedure Details Consent: Obtained Time Out: Verified patient identification, verified procedure, site/side was marked, verified correct patient position, special equipment/implants available, Radiology Safety Procedures followed,  medications/allergies/relevent history reviewed, required imaging and test results available.  Performed  Medications:  Pt sedated by anesthesia with diprovan 120 mg IV total.  Normal LV function; trace MR; negative saline microcavitation study.    Complications: No apparent complications Patient did tolerate procedure well.  Olga Millers, MD

## 2023-09-01 NOTE — Interval H&P Note (Signed)
 History and Physical Interval Note:  09/01/2023 7:21 AM  [name]  has presented today for surgery, with the diagnosis of STROKE.  The various methods of treatment have been discussed with the patient and family. After consideration of risks, benefits and other options for treatment, the patient has consented to  Procedure(s): TRANSESOPHAGEAL ECHOCARDIOGRAM (N/A) as a surgical intervention.  The patient's history has been reviewed, patient examined, no change in status, stable for surgery.  I have reviewed the patient's chart and labs.  Questions were answered to the patient's satisfaction.     Olga Millers

## 2023-09-01 NOTE — Transfer of Care (Signed)
 Immediate Anesthesia Transfer of Care Note  Patient: [name]  Procedure(s) Performed: TRANSESOPHAGEAL ECHOCARDIOGRAM  Patient Location: PACU  Anesthesia Type:MAC  Level of Consciousness: awake, alert , and oriented  Airway & Oxygen Therapy: Patient Spontanous Breathing and Patient connected to nasal cannula oxygen  Post-op Assessment: Report given to RN and Post -op Vital signs reviewed and stable  Post vital signs: Reviewed and stable  Last Vitals:  Vitals Value Taken Time  BP 92/69 09/01/23 0802  Temp 36.7 C 09/01/23 0802  Pulse 80 09/01/23 0802  Resp 17 09/01/23 0802  SpO2 98 % 09/01/23 0802  Vitals shown include unfiled device data.  Last Pain:  Vitals:   09/01/23 0802  TempSrc: Temporal  PainSc: 0-No pain         Complications: No notable events documented.

## 2023-09-01 NOTE — H&P (Signed)
 Office Visit 08/22/2023 [facility] at [facility], Leeroy Bock, [place] [facility] Retinal artery occlusion Dx Follow-up ; Referred by Gweneth Dimitri, MD Reason for Visit   Additional Documentation  Vitals: BP 118/72   Pulse 78   Ht 5\' 3"  (1.6 m)   Wt 52.1 kg   SpO2 98%   BMI 20.34 kg/m   BSA 1.52 m      More Vitals  Flowsheets: Anthropometrics,   NEWS,   MEWS Score,   Vital Signs,   Method of Visit  Encounter Info: Billing Info,   History,   Allergies,   Detailed Report   All Notes   Progress Notes by Jonita Albee, PA-C at 08/22/2023 3:10 PM  Author: Jonita Albee, PA-C Author Type: Physician Assistant Certified Filed: 08/22/2023  5:03 PM  Note Status: Signed Cosign: Cosign Not Required Encounter Date: 08/22/2023  Editor: Merlene Laughter (Physician Assistant Certified)             Expand All Collapse All  Cardiology Office Note:  .   Date:  08/22/2023  ID:  [name], DOB [birth year], MRN [id number] PCP: Gweneth Dimitri, MD  [facility] Providers Cardiologist:  Verne Carrow, MD    History of Present Illness: [name] Kitchen   [name] is a 53 y.o. female with a past medical history of anxiety, GERD, ocular stroke, RA. Patient is followed by Dr. Clifton James and presents today for an outpatient follow up appointment    Patient was first referred to Dr. Clifton James in 06/2023 for evaluation after she had an ocular stroke in 06/2022. She had issues with dizziness and headaches in 05/2022. Went on to develop right sided facial droop and was seen in the ED at [facility] on 06/09/22. Brain MRI was without evidence of stroke. She was seen by neurology and was discharged. Her estrogen and Vyvanse were stopped. She underwent echocardiogram on 06/30/23 that showed EF 55-60%, no regional wall motion abnormalities, normal diastolic parameters, normal RV systolic function, no significant valvular  abnormalities. Bubble study was negative, no evidence for PFO or ASD. Discussed wearing a cardiac monitor, but patient refused.    Patient has been seen by neurology, ophthalmology. 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 Neurology suspects ischemic event. She has been started on baby aspirin. Has been discussing statin therapy with other providers (PCP, neurology)    Today, patient presents for a follow up appointment. She previously had an ocular stroke in 06/2022. Since this event, she has been seen by multiple neurologists and ophthalmologists. There has been some debate as to what actually caused her facial droop/other symptoms, but currently it is believed that her symptoms were due to an ischemic event. Since then, she has had ongoing migraines. The migraines started in December of 2023. More recently, they have been occurring daily. The patient reports that the migraines are not responding to trigger point injections or medications. The patient is currently on Crestor for cholesterol management and takes a baby aspirin daily. The patient also reports a history of early menopause at age 3 and was previously on estrogen and Vyvanse, which she stopped taking after the ischemic event.   ROS: Denies chest pain, shortness of breath, syncope, near syncope. Has migraines    Studies Reviewed: .   Cardiac Studies & Procedures Objective  ______________________________________________________________________________________________       ECHOCARDIOGRAM   ECHOCARDIOGRAM COMPLETE BUBBLE STUDY 06/30/2023  Narrative ECHOCARDIOGRAM REPORT       Patient Name:   [name] Date of Exam: 06/30/2023 Medical Rec #:  [id number]               Height:       63.0 in Accession #:    [id number]              Weight:       120.0 lb Date of Birth:  01-07-1971                BSA:          1.556 m Patient Age:    52 years                 BP:           102/80 mmHg Patient Gender: F                       HR:           67 bpm. Exam Location:  Outpatient   Procedure: 2D Echo, Color Doppler, Cardiac Doppler and Saline Contrast Bubble Study   Indications:    Retinal artery occlusion [H34.9]   History:        Patient has no prior history of Echocardiogram examinations. Risk Factors:Hypertension.   Sonographer:    Neysa Bonito Roar Referring Phys: [id number] CHRISTOPHER D MCALHANY   IMPRESSIONS     1. Left ventricular ejection fraction, by estimation, is 55 to 60%. The left ventricle has normal function. The left ventricle has no regional wall motion abnormalities. Left ventricular diastolic parameters were normal. 2. Right ventricular systolic function is normal. The right ventricular size is normal. Tricuspid regurgitation signal is inadequate for assessing PA pressure. 3. The mitral valve is normal in structure. No evidence of mitral valve regurgitation. No evidence of mitral stenosis. 4. The aortic valve is tricuspid. Aortic valve regurgitation is not visualized. No aortic stenosis is present. 5. The inferior vena cava is normal in size with greater than 50% respiratory variability, suggesting right atrial pressure of 3 mmHg. 6. Negative bubble study, no evidence for PFO or ASD.   FINDINGS Left Ventricle: Left ventricular ejection fraction, by estimation, is 55 to 60%. The left ventricle has normal function. The left ventricle has no regional wall motion abnormalities. The left ventricular internal cavity size was normal in size. There is no left ventricular hypertrophy. Left ventricular diastolic parameters were normal.   Right Ventricle: The right ventricular size is normal. No increase in right ventricular wall thickness. Right ventricular systolic function is normal. Tricuspid regurgitation signal is inadequate for assessing PA pressure.   Left Atrium: Left atrial size was normal in size.   Right Atrium: Right atrial  size was normal in size.   Pericardium: There is no evidence of pericardial effusion.   Mitral Valve: The mitral valve is normal in structure. No evidence of mitral valve regurgitation. No evidence of mitral valve stenosis. MV peak gradient, 4.0 mmHg. The mean mitral valve gradient is 2.0 mmHg.   Tricuspid Valve: The tricuspid valve is normal in structure. Tricuspid valve regurgitation is not demonstrated.   Aortic Valve: The aortic valve is tricuspid. Aortic valve regurgitation is not visualized. No aortic stenosis is present. Aortic valve mean gradient measures 3.0 mmHg. Aortic valve peak gradient measures 6.5 mmHg. Aortic valve area, by VTI measures 2.42 cm.   Pulmonic Valve: The pulmonic valve was normal in structure. Pulmonic valve regurgitation is  not visualized.   Aorta: The aortic root is normal in size and structure.   Venous: The inferior vena cava is normal in size with greater than 50% respiratory variability, suggesting right atrial pressure of 3 mmHg.   IAS/Shunts: Negative bubble study, no evidence for PFO or ASD. Agitated saline contrast was given intravenously to evaluate for intracardiac shunting.     LEFT VENTRICLE PLAX 2D LVIDd:         3.70 cm     Diastology LVIDs:         2.70 cm     LV e' medial:    11.20 cm/s LV PW:         0.80 cm     LV E/e' medial:  10.1 LV IVS:        0.80 cm     LV e' lateral:   16.60 cm/s LVOT diam:     1.70 cm     LV E/e' lateral: 6.8 LV SV:         59 LV SV Index:   38 LVOT Area:     2.27 cm   LV Volumes (MOD) LV vol d, MOD A2C: 39.4 ml LV vol d, MOD A4C: 45.7 ml LV vol s, MOD A2C: 18.2 ml LV vol s, MOD A4C: 21.6 ml LV SV MOD A2C:     21.2 ml LV SV MOD A4C:     45.7 ml LV SV MOD BP:      22.6 ml   RIGHT VENTRICLE RV Basal diam:  3.00 cm RV Mid diam:    2.60 cm RV S prime:     12.00 cm/s TAPSE (M-mode): 2.3 cm   LEFT ATRIUM             Index        RIGHT ATRIUM           Index LA diam:        3.10 cm 1.99 cm/m   RA  Area:     10.80 cm LA Vol (A2C):   31.9 ml 20.50 ml/m  RA Volume:   23.30 ml  14.97 ml/m LA Vol (A4C):   20.3 ml 13.04 ml/m LA Biplane Vol: 27.2 ml 17.48 ml/m AORTIC VALVE                    PULMONIC VALVE AV Area (Vmax):    2.38 cm     PV Vmax:        0.93 m/s AV Area (Vmean):   2.17 cm     PV Peak grad:   3.4 mmHg AV Area (VTI):     2.42 cm     RVOT Peak grad: 2 mmHg AV Vmax:           127.00 cm/s AV Vmean:          86.900 cm/s AV VTI:            0.245 m AV Peak Grad:      6.5 mmHg AV Mean Grad:      3.0 mmHg LVOT Vmax:         133.00 cm/s LVOT Vmean:        83.000 cm/s LVOT VTI:          0.261 m LVOT/AV VTI ratio: 1.07   AORTA Ao Sinus diam: 2.20 cm Ao STJ diam:   2.1 cm Ao Asc diam:   2.80 cm   MITRAL VALVE MV Area (PHT): 4.74 cm  SHUNTS MV Area VTI:   2.20 cm     Systemic VTI:  0.26 m MV Peak grad:  4.0 mmHg     Systemic Diam: 1.70 cm MV Mean grad:  2.0 mmHg MV Vmax:       1.00 m/s MV Vmean:      63.9 cm/s MV Decel Time: 160 msec MV E velocity: 113.00 cm/s MV A velocity: 68.70 cm/s MV E/A ratio:  1.64   Dalton McleanMD Electronically signed by Wilfred Lacy Signature Date/Time: 06/30/2023/10:58:57 AM       Final             ______________________________________________________________________________________________        Risk Assessment/Calculations:             Physical Exam:   VS:  BP 118/72   Pulse 78   Ht 5\' 3"  (1.6 m)   Wt 114 lb 12.8 oz (52.1 kg)   SpO2 98%   BMI 20.34 kg/m       Wt Readings from Last 3 Encounters:  08/22/23 114 lb 12.8 oz (52.1 kg)  06/26/23 120 lb (54.4 kg)  03/27/23 125 lb (56.7 kg)    GEN: Well nourished, well developed in no acute distress. Sitting comfortably on the exam table  NECK: No JVD CARDIAC: RRR, no murmurs, rubs, gallops. Radial pulses 2+ bilaterally  RESPIRATORY:  Clear to auscultation without rales, wheezing or rhonchi. Normal WOB on room air   ABDOMEN: Soft, non-tender,  non-distended EXTREMITIES:  No edema in BLE; No deformity    ASSESSMENT AND PLAN: .     Ocular Stroke  - Patient had an ocular stroke in 06/2022. Neurology requesting TEE  - Carotid ultrasounds in 07/2022 showed no evidence of stenosis in bilateral ICAs. CTA head neck in 06/2023 showed minimal plaque in the ICA bulb, no stenosis. Normal cervical ICAs bilaterally  - Echocardiogram from 06/2023 showed EF 55-60%, no wall motion abnormalities, no PFO or ASD. Neurology requesting TEE  - Discussed indications for TEE, risks, and benefits. Patient is willing to proceed. We have scheduled TEE for 3/28 - Ordered BMP, CBC to be collected prior to TEE  - Instructed patient to hold zepbound for 1 week prior to TEE  - Discussed cardiac monitor with her history of CVA, but patient refuses at this time  - Continue ASA 81 mg daily  - Lipid panel from 01/2023 showed LDL 162. Patient is now on crestor 5 mg daily per neurology      Informed Consent Shared Decision Making/Informed Consent{     The risks [esophageal damage, perforation (1:10,000 risk), bleeding, pharyngeal hematoma as well as other potential complications associated with conscious sedation including aspiration, arrhythmia, respiratory failure and death], benefits (treatment guidance and diagnostic support) and alternatives of a transesophageal echocardiogram were discussed in detail with Ms. Dambrosia and she is willing to proceed.         Dispo: Follow up in 1 year with Dr. Clifton James    Signed, Jonita Albee, PA-C       For TEE; no changes. Olga Millers

## 2023-09-01 NOTE — Anesthesia Postprocedure Evaluation (Signed)
 Anesthesia Post Note  Patient: [name]  Procedure(s) Performed: TRANSESOPHAGEAL ECHOCARDIOGRAM     Patient location during evaluation: PACU Anesthesia Type: MAC Level of consciousness: awake and alert Pain management: pain level controlled Vital Signs Assessment: post-procedure vital signs reviewed and stable Respiratory status: spontaneous breathing, nonlabored ventilation and respiratory function stable Cardiovascular status: blood pressure returned to baseline and stable Postop Assessment: no apparent nausea or vomiting Anesthetic complications: no   No notable events documented.  Last Vitals:  Vitals:   09/01/23 0810 09/01/23 0832  BP: 109/74   Pulse: 80   Resp: 16   Temp:    SpO2: 100% 100%    Last Pain:  Vitals:   09/01/23 0832  TempSrc:   PainSc: 0-No pain                 Lowella Curb

## 2023-09-01 NOTE — Anesthesia Preprocedure Evaluation (Signed)
 Anesthesia Evaluation  Patient identified by MRN, date of birth, ID band Patient awake    Reviewed: Allergy & Precautions, NPO status , Patient's Chart, lab work & pertinent test results  Airway Mallampati: I  TM Distance: >3 FB Neck ROM: Full    Dental no notable dental hx. (+) Teeth Intact, Dental Advisory Given   Pulmonary asthma    Pulmonary exam normal breath sounds clear to auscultation       Cardiovascular hypertension, Pt. on medications negative cardio ROS Normal cardiovascular exam Rhythm:Regular Rate:Normal     Neuro/Psych  Headaches PSYCHIATRIC DISORDERS Anxiety      Neuromuscular disease CVA, No Residual Symptoms    GI/Hepatic Neg liver ROS,GERD  Medicated,,  Endo/Other  negative endocrine ROS    Renal/GU negative Renal ROS  negative genitourinary   Musculoskeletal  (+) Arthritis , Rheumatoid disorders,  Fibromyalgia -  Abdominal   Peds  Hematology negative hematology ROS (+)   Anesthesia Other Findings EGD for epigastric pain, gastritis, reflux  Reproductive/Obstetrics                             Anesthesia Physical Anesthesia Plan  ASA: II  Anesthesia Plan: MAC   Post-op Pain Management: Minimal or no pain anticipated   Induction: Intravenous  PONV Risk Score and Plan: 2 and Propofol infusion and Treatment may vary due to age or medical condition  Airway Management Planned: Natural Airway  Additional Equipment:   Intra-op Plan:   Post-operative Plan:   Informed Consent: I have reviewed the patients History and Physical, chart, labs and discussed the procedure including the risks, benefits and alternatives for the proposed anesthesia with the patient or authorized representative who has indicated his/her understanding and acceptance.     Dental advisory given  Plan Discussed with: CRNA  Anesthesia Plan Comments:         Anesthesia Quick  Evaluation

## 2023-10-19 DIAGNOSIS — H9313 Tinnitus, bilateral: Secondary | ICD-10-CM | POA: Diagnosis not present

## 2023-10-19 DIAGNOSIS — J310 Chronic rhinitis: Secondary | ICD-10-CM | POA: Diagnosis not present

## 2023-10-26 DIAGNOSIS — G518 Other disorders of facial nerve: Secondary | ICD-10-CM | POA: Diagnosis not present

## 2023-10-26 DIAGNOSIS — M542 Cervicalgia: Secondary | ICD-10-CM | POA: Diagnosis not present

## 2023-10-26 DIAGNOSIS — M791 Myalgia, unspecified site: Secondary | ICD-10-CM | POA: Diagnosis not present

## 2023-10-26 DIAGNOSIS — G43719 Chronic migraine without aura, intractable, without status migrainosus: Secondary | ICD-10-CM | POA: Diagnosis not present

## 2023-11-06 DIAGNOSIS — G250 Essential tremor: Secondary | ICD-10-CM | POA: Diagnosis not present

## 2023-11-06 DIAGNOSIS — M069 Rheumatoid arthritis, unspecified: Secondary | ICD-10-CM | POA: Diagnosis not present

## 2023-11-06 DIAGNOSIS — F439 Reaction to severe stress, unspecified: Secondary | ICD-10-CM | POA: Diagnosis not present

## 2023-11-06 DIAGNOSIS — K5909 Other constipation: Secondary | ICD-10-CM | POA: Diagnosis not present

## 2023-11-11 ENCOUNTER — Other Ambulatory Visit: Payer: Self-pay | Admitting: Internal Medicine

## 2023-11-23 DIAGNOSIS — E538 Deficiency of other specified B group vitamins: Secondary | ICD-10-CM | POA: Diagnosis not present

## 2023-11-23 DIAGNOSIS — F411 Generalized anxiety disorder: Secondary | ICD-10-CM | POA: Diagnosis not present

## 2023-11-23 DIAGNOSIS — G250 Essential tremor: Secondary | ICD-10-CM | POA: Diagnosis not present

## 2023-11-23 DIAGNOSIS — Z0000 Encounter for general adult medical examination without abnormal findings: Secondary | ICD-10-CM | POA: Diagnosis not present

## 2023-11-23 DIAGNOSIS — R5383 Other fatigue: Secondary | ICD-10-CM | POA: Diagnosis not present

## 2023-11-23 DIAGNOSIS — E785 Hyperlipidemia, unspecified: Secondary | ICD-10-CM | POA: Diagnosis not present

## 2023-11-23 DIAGNOSIS — G43909 Migraine, unspecified, not intractable, without status migrainosus: Secondary | ICD-10-CM | POA: Diagnosis not present

## 2023-11-23 DIAGNOSIS — Z23 Encounter for immunization: Secondary | ICD-10-CM | POA: Diagnosis not present

## 2023-12-06 DIAGNOSIS — M542 Cervicalgia: Secondary | ICD-10-CM | POA: Diagnosis not present

## 2023-12-06 DIAGNOSIS — G518 Other disorders of facial nerve: Secondary | ICD-10-CM | POA: Diagnosis not present

## 2023-12-06 DIAGNOSIS — M791 Myalgia, unspecified site: Secondary | ICD-10-CM | POA: Diagnosis not present

## 2023-12-06 DIAGNOSIS — G43719 Chronic migraine without aura, intractable, without status migrainosus: Secondary | ICD-10-CM | POA: Diagnosis not present

## 2023-12-25 DIAGNOSIS — M791 Myalgia, unspecified site: Secondary | ICD-10-CM | POA: Diagnosis not present

## 2023-12-25 DIAGNOSIS — G518 Other disorders of facial nerve: Secondary | ICD-10-CM | POA: Diagnosis not present

## 2023-12-25 DIAGNOSIS — G43719 Chronic migraine without aura, intractable, without status migrainosus: Secondary | ICD-10-CM | POA: Diagnosis not present

## 2023-12-25 DIAGNOSIS — M542 Cervicalgia: Secondary | ICD-10-CM | POA: Diagnosis not present

## 2023-12-30 ENCOUNTER — Other Ambulatory Visit (HOSPITAL_BASED_OUTPATIENT_CLINIC_OR_DEPARTMENT_OTHER): Payer: Self-pay | Admitting: Family Medicine

## 2023-12-30 DIAGNOSIS — E2839 Other primary ovarian failure: Secondary | ICD-10-CM

## 2024-01-01 DIAGNOSIS — H9313 Tinnitus, bilateral: Secondary | ICD-10-CM | POA: Insufficient documentation

## 2024-01-01 DIAGNOSIS — H8111 Benign paroxysmal vertigo, right ear: Secondary | ICD-10-CM | POA: Insufficient documentation

## 2024-01-01 DIAGNOSIS — Z0110 Encounter for examination of ears and hearing without abnormal findings: Secondary | ICD-10-CM | POA: Diagnosis not present

## 2024-01-01 DIAGNOSIS — H6993 Unspecified Eustachian tube disorder, bilateral: Secondary | ICD-10-CM | POA: Diagnosis not present

## 2024-01-11 DIAGNOSIS — G43E09 Chronic migraine with aura, not intractable, without status migrainosus: Secondary | ICD-10-CM | POA: Diagnosis not present

## 2024-01-18 ENCOUNTER — Ambulatory Visit (HOSPITAL_BASED_OUTPATIENT_CLINIC_OR_DEPARTMENT_OTHER)
Admission: RE | Admit: 2024-01-18 | Discharge: 2024-01-18 | Disposition: A | Discharge status: Home / Self Care | Source: Ambulatory Visit | Attending: Family Medicine | Admitting: Family Medicine

## 2024-01-18 DIAGNOSIS — M85851 Other specified disorders of bone density and structure, right thigh: Secondary | ICD-10-CM | POA: Diagnosis not present

## 2024-01-18 DIAGNOSIS — E2839 Other primary ovarian failure: Secondary | ICD-10-CM | POA: Diagnosis not present

## 2024-01-18 DIAGNOSIS — Z780 Asymptomatic menopausal state: Secondary | ICD-10-CM | POA: Diagnosis not present

## 2024-01-18 DIAGNOSIS — M85852 Other specified disorders of bone density and structure, left thigh: Secondary | ICD-10-CM | POA: Diagnosis not present

## 2024-01-23 DIAGNOSIS — M85851 Other specified disorders of bone density and structure, right thigh: Secondary | ICD-10-CM | POA: Diagnosis not present

## 2024-01-23 DIAGNOSIS — G43109 Migraine with aura, not intractable, without status migrainosus: Secondary | ICD-10-CM | POA: Diagnosis not present

## 2024-01-23 DIAGNOSIS — F331 Major depressive disorder, recurrent, moderate: Secondary | ICD-10-CM | POA: Diagnosis not present

## 2024-01-23 DIAGNOSIS — Z23 Encounter for immunization: Secondary | ICD-10-CM | POA: Diagnosis not present

## 2024-02-09 ENCOUNTER — Other Ambulatory Visit: Payer: Self-pay | Admitting: Physician Assistant

## 2024-02-10 ENCOUNTER — Other Ambulatory Visit: Payer: Self-pay | Admitting: Internal Medicine

## 2024-02-20 ENCOUNTER — Emergency Department (HOSPITAL_COMMUNITY)

## 2024-02-20 ENCOUNTER — Ambulatory Visit: Admitting: Radiology

## 2024-02-20 ENCOUNTER — Encounter (HOSPITAL_COMMUNITY)
Admission: EM | Disposition: A | Discharge status: Home / Self Care | Payer: Self-pay | Source: Ambulatory Visit | Attending: Emergency Medicine

## 2024-02-20 ENCOUNTER — Other Ambulatory Visit: Payer: Self-pay

## 2024-02-20 ENCOUNTER — Encounter (HOSPITAL_COMMUNITY): Payer: Self-pay

## 2024-02-20 ENCOUNTER — Emergency Department (HOSPITAL_COMMUNITY): Admitting: Certified Registered Nurse Anesthetist

## 2024-02-20 ENCOUNTER — Ambulatory Visit (HOSPITAL_COMMUNITY)
Admission: EM | Admit: 2024-02-20 | Discharge: 2024-02-20 | Disposition: A | Discharge status: Home / Self Care | Source: Ambulatory Visit | Attending: Emergency Medicine | Admitting: Emergency Medicine

## 2024-02-20 DIAGNOSIS — N201 Calculus of ureter: Secondary | ICD-10-CM

## 2024-02-20 DIAGNOSIS — N134 Hydroureter: Secondary | ICD-10-CM | POA: Diagnosis not present

## 2024-02-20 DIAGNOSIS — R109 Unspecified abdominal pain: Secondary | ICD-10-CM | POA: Diagnosis not present

## 2024-02-20 DIAGNOSIS — K219 Gastro-esophageal reflux disease without esophagitis: Secondary | ICD-10-CM | POA: Diagnosis not present

## 2024-02-20 DIAGNOSIS — E785 Hyperlipidemia, unspecified: Secondary | ICD-10-CM | POA: Diagnosis not present

## 2024-02-20 DIAGNOSIS — M069 Rheumatoid arthritis, unspecified: Secondary | ICD-10-CM | POA: Insufficient documentation

## 2024-02-20 DIAGNOSIS — R160 Hepatomegaly, not elsewhere classified: Secondary | ICD-10-CM | POA: Diagnosis not present

## 2024-02-20 DIAGNOSIS — I10 Essential (primary) hypertension: Secondary | ICD-10-CM | POA: Diagnosis not present

## 2024-02-20 DIAGNOSIS — M797 Fibromyalgia: Secondary | ICD-10-CM | POA: Diagnosis not present

## 2024-02-20 DIAGNOSIS — J45909 Unspecified asthma, uncomplicated: Secondary | ICD-10-CM | POA: Insufficient documentation

## 2024-02-20 DIAGNOSIS — F419 Anxiety disorder, unspecified: Secondary | ICD-10-CM | POA: Insufficient documentation

## 2024-02-20 DIAGNOSIS — Z79899 Other long term (current) drug therapy: Secondary | ICD-10-CM | POA: Diagnosis not present

## 2024-02-20 DIAGNOSIS — Z7985 Long-term (current) use of injectable non-insulin antidiabetic drugs: Secondary | ICD-10-CM | POA: Insufficient documentation

## 2024-02-20 DIAGNOSIS — N132 Hydronephrosis with renal and ureteral calculous obstruction: Secondary | ICD-10-CM | POA: Insufficient documentation

## 2024-02-20 DIAGNOSIS — N202 Calculus of kidney with calculus of ureter: Secondary | ICD-10-CM | POA: Diagnosis not present

## 2024-02-20 HISTORY — PX: CYSTOSCOPY, WITH RETROGRADE PYELOGRAM, URETEROSCOPY, URINARY CALCULUS LASER LITHOTRIPSY, AND STENT INSERT: SHX7550

## 2024-02-20 LAB — CBC WITH DIFFERENTIAL/PLATELET
Abs Immature Granulocytes: 0.02 K/uL (ref 0.00–0.07)
Basophils Absolute: 0.1 K/uL (ref 0.0–0.1)
Basophils Relative: 1 %
Eosinophils Absolute: 0.4 K/uL (ref 0.0–0.5)
Eosinophils Relative: 6 %
HCT: 41.1 % (ref 36.0–46.0)
Hemoglobin: 12.8 g/dL (ref 12.0–15.0)
Immature Granulocytes: 0 %
Lymphocytes Relative: 26 %
Lymphs Abs: 1.6 K/uL (ref 0.7–4.0)
MCH: 29.3 pg (ref 26.0–34.0)
MCHC: 31.1 g/dL (ref 30.0–36.0)
MCV: 94.1 fL (ref 80.0–100.0)
Monocytes Absolute: 0.5 K/uL (ref 0.1–1.0)
Monocytes Relative: 8 %
Neutro Abs: 3.6 K/uL (ref 1.7–7.7)
Neutrophils Relative %: 59 %
Platelets: 300 K/uL (ref 150–400)
RBC: 4.37 MIL/uL (ref 3.87–5.11)
RDW: 12.9 % (ref 11.5–15.5)
WBC: 6.1 K/uL (ref 4.0–10.5)
nRBC: 0 % (ref 0.0–0.2)

## 2024-02-20 LAB — COMPREHENSIVE METABOLIC PANEL WITH GFR
ALT: 25 U/L (ref 0–44)
AST: 27 U/L (ref 15–41)
Albumin: 4.7 g/dL (ref 3.5–5.0)
Alkaline Phosphatase: 73 U/L (ref 38–126)
Anion gap: 14 (ref 5–15)
BUN: 15 mg/dL (ref 6–20)
CO2: 17 mmol/L — ABNORMAL LOW (ref 22–32)
Calcium: 9.5 mg/dL (ref 8.9–10.3)
Chloride: 108 mmol/L (ref 98–111)
Creatinine, Ser: 0.9 mg/dL (ref 0.44–1.00)
GFR, Estimated: >60 mL/min (ref >60)
Glucose, Bld: 95 mg/dL (ref 70–99)
Potassium: 4.3 mmol/L (ref 3.5–5.1)
Sodium: 139 mmol/L (ref 135–145)
Total Bilirubin: 0.2 mg/dL (ref 0.0–1.2)
Total Protein: 7.2 g/dL (ref 6.5–8.1)

## 2024-02-20 LAB — URINALYSIS, ROUTINE W REFLEX MICROSCOPIC
Bilirubin Urine: NEGATIVE
Glucose, UA: NEGATIVE mg/dL
Ketones, ur: NEGATIVE mg/dL
Leukocytes,Ua: NEGATIVE
Nitrite: NEGATIVE
Protein, ur: 30 mg/dL — AB
RBC / HPF: >50 RBC/hpf (ref 0–5)
Specific Gravity, Urine: 1.014 (ref 1.005–1.030)
pH: 6 (ref 5.0–8.0)

## 2024-02-20 LAB — PREGNANCY, URINE: Preg Test, Ur: NEGATIVE

## 2024-02-20 LAB — LIPASE, BLOOD: Lipase: 44 U/L (ref 11–51)

## 2024-02-20 SURGERY — CYSTOSCOPY, WITH RETROGRADE PYELOGRAM, URETEROSCOPY, URINARY CALCULUS LASER LITHOTRIPSY, AND STENT INSERT
Anesthesia: General | Laterality: Left

## 2024-02-20 MED ORDER — HYDROMORPHONE HCL 1 MG/ML IJ SOLN
0.5000 mg | Freq: Once | INTRAMUSCULAR | Status: AC
  Administered 2024-02-20: 0.5 mg via INTRAVENOUS
  Filled 2024-02-20: qty 1

## 2024-02-20 MED ORDER — IOHEXOL 300 MG/ML  SOLN
INTRAMUSCULAR | Status: DC | PRN
  Administered 2024-02-20: 10 mL

## 2024-02-20 MED ORDER — PHENYLEPHRINE 80 MCG/ML (10ML) SYRINGE FOR IV PUSH (FOR BLOOD PRESSURE SUPPORT)
PREFILLED_SYRINGE | INTRAVENOUS | Status: DC | PRN
  Administered 2024-02-20: 80 ug via INTRAVENOUS
  Administered 2024-02-20: 160 ug via INTRAVENOUS
  Administered 2024-02-20: 80 ug via INTRAVENOUS

## 2024-02-20 MED ORDER — DEXAMETHASONE SODIUM PHOSPHATE 10 MG/ML IJ SOLN
INTRAMUSCULAR | Status: DC | PRN
  Administered 2024-02-20: 10 mg via INTRAVENOUS

## 2024-02-20 MED ORDER — HYDROCODONE-ACETAMINOPHEN 5-325 MG PO TABS: 1.0000 | ORAL_TABLET | Freq: Four times a day (QID) | ORAL | 0 refills | Status: DC | PRN

## 2024-02-20 MED ORDER — MIDAZOLAM HCL 2 MG/2ML IJ SOLN
INTRAMUSCULAR | Status: DC | PRN
  Administered 2024-02-20: 2 mg via INTRAVENOUS

## 2024-02-20 MED ORDER — FENTANYL CITRATE (PF) 250 MCG/5ML IJ SOLN
INTRAMUSCULAR | Status: DC | PRN
  Administered 2024-02-20: 50 ug via INTRAVENOUS

## 2024-02-20 MED ORDER — EPHEDRINE SULFATE-NACL 50-0.9 MG/10ML-% IV SOSY
PREFILLED_SYRINGE | INTRAVENOUS | Status: DC | PRN
  Administered 2024-02-20: 5 mg via INTRAVENOUS

## 2024-02-20 MED ORDER — ONDANSETRON HCL 4 MG/2ML IJ SOLN: 4.0000 mg | Freq: Once | INTRAMUSCULAR | Status: DC | PRN

## 2024-02-20 MED ORDER — MORPHINE SULFATE (PF) 4 MG/ML IV SOLN
4.0000 mg | Freq: Once | INTRAVENOUS | Status: AC
  Administered 2024-02-20: 4 mg via INTRAVENOUS
  Filled 2024-02-20: qty 1

## 2024-02-20 MED ORDER — ONDANSETRON HCL 4 MG/2ML IJ SOLN
4.0000 mg | Freq: Once | INTRAMUSCULAR | Status: AC | PRN
  Administered 2024-02-20: 4 mg via INTRAVENOUS
  Filled 2024-02-20: qty 2

## 2024-02-20 MED ORDER — FENTANYL CITRATE (PF) 100 MCG/2ML IJ SOLN
INTRAMUSCULAR | Status: AC
  Filled 2024-02-20: qty 2

## 2024-02-20 MED ORDER — AMISULPRIDE (ANTIEMETIC) 5 MG/2ML IV SOLN: 10.0000 mg | Freq: Once | INTRAVENOUS | Status: DC | PRN

## 2024-02-20 MED ORDER — LIDOCAINE 2% (20 MG/ML) 5 ML SYRINGE
INTRAMUSCULAR | Status: DC | PRN
  Administered 2024-02-20: 40 mg via INTRAVENOUS

## 2024-02-20 MED ORDER — OXYCODONE HCL 5 MG PO TABS
5.0000 mg | ORAL_TABLET | Freq: Once | ORAL | Status: AC | PRN
  Administered 2024-02-20: 5 mg via ORAL

## 2024-02-20 MED ORDER — SODIUM CHLORIDE 0.9 % IV SOLN: INTRAVENOUS | Status: DC

## 2024-02-20 MED ORDER — ONDANSETRON HCL 4 MG/2ML IJ SOLN
4.0000 mg | Freq: Once | INTRAMUSCULAR | Status: AC
  Administered 2024-02-20: 4 mg via INTRAVENOUS
  Filled 2024-02-20: qty 2

## 2024-02-20 MED ORDER — PROPOFOL 10 MG/ML IV BOLUS
INTRAVENOUS | Status: DC | PRN
  Administered 2024-02-20: 120 mg via INTRAVENOUS

## 2024-02-20 MED ORDER — TAMSULOSIN HCL 0.4 MG PO CAPS: 0.4000 mg | ORAL_CAPSULE | Freq: Every day | ORAL | 1 refills | Status: DC

## 2024-02-20 MED ORDER — KETOROLAC TROMETHAMINE 15 MG/ML IJ SOLN
15.0000 mg | Freq: Once | INTRAMUSCULAR | Status: AC
  Administered 2024-02-20: 15 mg via INTRAVENOUS
  Filled 2024-02-20: qty 1

## 2024-02-20 MED ORDER — SODIUM CHLORIDE 0.9 % IR SOLN
Status: DC | PRN
  Administered 2024-02-20: 3000 mL via INTRAVESICAL

## 2024-02-20 MED ORDER — CEFAZOLIN SODIUM-DEXTROSE 2-4 GM/100ML-% IV SOLN
2.0000 g | Freq: Once | INTRAVENOUS | Status: AC
  Administered 2024-02-20: 2 g via INTRAVENOUS
  Filled 2024-02-20: qty 100

## 2024-02-20 MED ORDER — ACETAMINOPHEN 500 MG PO TABS: 1000.0000 mg | ORAL_TABLET | Freq: Once | ORAL | Status: DC

## 2024-02-20 MED ORDER — FENTANYL CITRATE PF 50 MCG/ML IJ SOSY
100.0000 ug | PREFILLED_SYRINGE | Freq: Once | INTRAMUSCULAR | Status: AC
  Administered 2024-02-20: 100 ug via INTRAVENOUS
  Filled 2024-02-20: qty 2

## 2024-02-20 MED ORDER — ACETAMINOPHEN 10 MG/ML IV SOLN
INTRAVENOUS | Status: DC | PRN
  Administered 2024-02-20: 1000 mg via INTRAVENOUS

## 2024-02-20 MED ORDER — OXYCODONE HCL 5 MG/5ML PO SOLN: 5.0000 mg | Freq: Once | ORAL | Status: AC | PRN

## 2024-02-20 MED ORDER — ONDANSETRON HCL 4 MG/2ML IJ SOLN
INTRAMUSCULAR | Status: DC | PRN
  Administered 2024-02-20: 4 mg via INTRAVENOUS

## 2024-02-20 MED ORDER — SCOPOLAMINE 1 MG/3DAYS TD PT72
1.0000 | MEDICATED_PATCH | TRANSDERMAL | Status: DC
  Administered 2024-02-20: 1 mg via TRANSDERMAL

## 2024-02-20 MED ORDER — ACETAMINOPHEN 10 MG/ML IV SOLN
INTRAVENOUS | Status: AC
  Filled 2024-02-20: qty 100

## 2024-02-20 MED ORDER — MIDAZOLAM HCL 2 MG/2ML IJ SOLN
INTRAMUSCULAR | Status: AC
  Filled 2024-02-20: qty 2

## 2024-02-20 MED ORDER — SCOPOLAMINE 1 MG/3DAYS TD PT72
MEDICATED_PATCH | TRANSDERMAL | Status: AC
  Filled 2024-02-20: qty 1

## 2024-02-20 MED ORDER — OXYCODONE HCL 5 MG PO TABS
ORAL_TABLET | ORAL | Status: AC
  Filled 2024-02-20: qty 1

## 2024-02-20 MED ORDER — PROPOFOL 10 MG/ML IV BOLUS
INTRAVENOUS | Status: AC
  Filled 2024-02-20: qty 20

## 2024-02-20 MED ORDER — HYDROMORPHONE HCL 1 MG/ML IJ SOLN: 0.2500 mg | INTRAMUSCULAR | Status: DC | PRN

## 2024-02-20 MED ORDER — SUCCINYLCHOLINE CHLORIDE 200 MG/10ML IV SOSY
PREFILLED_SYRINGE | INTRAVENOUS | Status: DC | PRN
  Administered 2024-02-20: 100 mg via INTRAVENOUS

## 2024-02-20 SURGICAL SUPPLY — 19 items
BAG URO CATCHER STRL LF (MISCELLANEOUS) ×2 IMPLANT
BASKET LASER NITINOL 1.9FR (BASKET) IMPLANT
BASKET ZERO TIP NITINOL 2.4FR (BASKET) IMPLANT
CATH URETERAL DUAL LUMEN 10F (MISCELLANEOUS) IMPLANT
CATH URETL OPEN END 6FR 70 (CATHETERS) ×2 IMPLANT
CLOTH BEACON ORANGE TIMEOUT ST (SAFETY) ×2 IMPLANT
GLOVE BIO SURGEON STRL SZ7.5 (GLOVE) ×2 IMPLANT
GOWN STRL REUS W/ TWL XL LVL3 (GOWN DISPOSABLE) ×2 IMPLANT
GUIDEWIRE ANG ZIPWIRE 038X150 (WIRE) IMPLANT
GUIDEWIRE STR DUAL SENSOR (WIRE) ×2 IMPLANT
KIT TURNOVER KIT A (KITS) ×2 IMPLANT
MANIFOLD NEPTUNE II (INSTRUMENTS) ×2 IMPLANT
PACK CYSTO (CUSTOM PROCEDURE TRAY) ×2 IMPLANT
SHEATH NAVIGATOR HD 11/13X28 (SHEATH) IMPLANT
SHEATH NAVIGATOR HD 11/13X36 (SHEATH) IMPLANT
STENT URET 6FRX24 CONTOUR (STENTS) IMPLANT
TRACTIP FLEXIVA PULS ID 200XHI (Laser) IMPLANT
TUBING CONNECTING 10 (TUBING) ×2 IMPLANT
TUBING UROLOGY SET (TUBING) ×2 IMPLANT

## 2024-02-20 NOTE — Op Note (Signed)
 Operative Note  Preoperative diagnosis:  1.  Left renal and ureteral calculus  Postoperative diagnosis: 1.  Left renal and ureteral calculus  Procedure(s): 1.  Cystoscopy with left retrograde pyelogram, left ureteroscopy with laser lithotripsy and stone basketing, ureteral stent placement  Surgeon: Sherwood Edison, MD  Assistants: None  Anesthesia: General  Complications: None immediate  EBL: Minimal  Specimens: 1.  Renal calculi  Drains/Catheters: 1.  6 x 24 double-J ureteral stent  Intraoperative findings: 1.  Normal urethra and bladder 2.  6 mm distal left ureteral calculus fragmented and extracted.  Remainder of ureter clear of stone.  Left kidney with multiple small calculi that were basket extracted.  She had multiple stones adherent within papillae covered with mucosa consistent with Randall's plaques.  All clinically significant stones were removed. 3.  Left retrograde pyelogram revealed no significant hydronephrosis and no filling defect after treatment of the stones  Indication: 53 year old female with a left ureteral calculus and renal calculi presents for the previously mentioned operation.  Description of procedure:  The patient was identified and consent was obtained.  The patient was taken to the operating room and placed in the supine position.  The patient was placed under general anesthesia.  Perioperative antibiotics were administered.  The patient was placed in dorsal lithotomy.  Patient was prepped and draped in a standard sterile fashion and a timeout was performed.  A 21 French rigid cystoscope was advanced into the urethra and into the bladder.  Complete cystoscopy was performed with the findings noted above.  Left ureter was cannulated with a sensor wire which was advanced up to the kidney under fluoroscopic guidance.  Semirigid ureteroscopy was performed alongside the wire up to the stone in the distal ureter which was laser fragmented to smaller fragments  followed by basket extraction.  I advanced the scope up to the renal pelvis and no other calculi were seen.  I passed a second wire through the scope and into the kidney.  An 11 x 13 ureteral access sheath was advanced over 1 the wires under continuous fluoroscopic guidance up to the proximal ureter.  Inner sheath and wire were withdrawn.  Digital ureteroscopy was performed and several small stones were basket extracted.  She had a number of stones within papillae that were covered with mucosa consistent with Randall's plaques.  All not adherent mobile stones that were large enough for basket extraction were extracted.  I then shot a retrograde pyelogram through the scope with the findings noted above.  I withdrew the scope along with the access sheath visualizing the ureter upon removal.  There were no ureteral calculi and no ureteral injury was identified.  I backloaded the wire onto a rigid cystoscope and advanced that into the bladder followed by routine placement of a 6 x 24 double-J ureteral stent.  Fluoroscopy confirmed proximal placement and direct visualization confirmed a good coil within the bladder.  I drained the bladder and withdrew the scope.  Patient tolerated the procedure well was stable postoperatively.  Plan: Follow-up in 1 week for stent removal

## 2024-02-20 NOTE — Anesthesia Procedure Notes (Signed)
 Procedure Name: Intubation Date/Time: 02/20/2024 4:01 PM  Performed by: Cena Epps, CRNAPre-anesthesia Checklist: Patient identified, Emergency Drugs available, Suction available and Patient being monitored Patient Re-evaluated:Patient Re-evaluated prior to induction Oxygen Delivery Method: Circle System Utilized Preoxygenation: Pre-oxygenation with 100% oxygen Induction Type: IV induction, Rapid sequence and Cricoid Pressure applied Laryngoscope Size: Mac and 3 Grade View: Grade I Tube type: Oral Tube size: 7.0 mm Number of attempts: 1 Airway Equipment and Method: Stylet and Oral airway Placement Confirmation: ETT inserted through vocal cords under direct vision, positive ETCO2 and breath sounds checked- equal and bilateral Secured at: 21 cm Tube secured with: Tape Dental Injury: Teeth and Oropharynx as per pre-operative assessment

## 2024-02-20 NOTE — Anesthesia Postprocedure Evaluation (Signed)
 Anesthesia Post Note  Patient: [name]  Procedure(s) Performed: CYSTOSCOPY, WITH LEFT RETROGRADE PYELOGRAM, URETEROSCOPY, URINARY CALCULUS LASER LITHOTRIPSY, AND LEFT STENT INSERT (Left)     Patient location during evaluation: PACU Anesthesia Type: General Level of consciousness: awake and alert, oriented and patient cooperative Pain management: pain level controlled Vital Signs Assessment: post-procedure vital signs reviewed and stable Respiratory status: spontaneous breathing, nonlabored ventilation and respiratory function stable Cardiovascular status: blood pressure returned to baseline and stable Postop Assessment: no apparent nausea or vomiting Anesthetic complications: no   No notable events documented.  Last Vitals:  Vitals:   02/20/24 1725 02/20/24 1734  BP: 112/82 (!) 128/92  Pulse: 94 98  Resp: 17 15  Temp:  36.7 C  SpO2: 99% 100%    Last Pain:  Vitals:   02/20/24 1814  TempSrc:   PainSc: 3                  [name]

## 2024-02-20 NOTE — ED Triage Notes (Addendum)
 Pt reports with lower abdominal pain and left flank pain since Saturday. Pt reports dark urine.

## 2024-02-20 NOTE — Transfer of Care (Signed)
 Immediate Anesthesia Transfer of Care Note  Patient: [name]  Procedure(s) Performed: CYSTOSCOPY, WITH LEFT RETROGRADE PYELOGRAM, URETEROSCOPY, URINARY CALCULUS LASER LITHOTRIPSY, AND LEFT STENT INSERT (Left)  Patient Location: PACU  Anesthesia Type:General  Level of Consciousness: awake  Airway & Oxygen Therapy: Patient Spontanous Breathing and Patient connected to nasal cannula oxygen  Post-op Assessment: Report given to RN and Post -op Vital signs reviewed and stable  Post vital signs: Reviewed and stable  Last Vitals:  Vitals Value Taken Time  BP 114/83 02/20/24 16:55  Temp    Pulse 96 02/20/24 16:59  Resp 16 02/20/24 16:59  SpO2 100 % 02/20/24 16:59  Vitals shown include unfiled device data.  Last Pain:  Vitals:   02/20/24 1506  TempSrc: Oral  PainSc: 3          Complications: No notable events documented.

## 2024-02-20 NOTE — ED Notes (Signed)
 Unable to get all labs. Pt in a lot of pain at this time and uncooperative.

## 2024-02-20 NOTE — Anesthesia Preprocedure Evaluation (Addendum)
 Anesthesia Evaluation  Patient identified by MRN, date of birth, ID band Patient awake    Reviewed: Allergy & Precautions, NPO status , Patient's Chart, lab work & pertinent test results, reviewed documented beta blocker date and time   History of Anesthesia Complications Negative for: history of anesthetic complications  Airway Mallampati: II  TM Distance: >3 FB Neck ROM: Full    Dental  (+) Dental Advisory Given Bonding:   Pulmonary asthma    breath sounds clear to auscultation       Cardiovascular hypertension, Pt. on medications and Pt. on home beta blockers  Rhythm:Regular Rate:Normal  06/2023 ECHO: EF 55 to 60%.  1. The LV has normal function, no regional wall motion abnormalities. Left ventricular diastolic parameters were normal.   2. RVF is normal. The right ventricular size is normal. Tricuspid regurgitation signal is inadequate for assessing PA pressure.   3. The mitral valve is normal in structure. No evidence of mitral valve regurgitation. No evidence of mitral stenosis.   4. The aortic valve is tricuspid. Aortic valve regurgitation is not visualized. No aortic stenosis is present.     Neuro/Psych  Headaches PSYCHIATRIC DISORDERS Anxiety     H/o retinal artery occlusion    GI/Hepatic Neg liver ROS,GERD  Medicated and Controlled,,Zepbound   Endo/Other  negative endocrine ROS  Zepbound: last 2d ago  Renal/GU Renal disease (L ureteral stone)     Musculoskeletal  (+) Arthritis , Rheumatoid disorders,  Fibromyalgia -  Abdominal   Peds  Hematology   Anesthesia Other Findings Zepbound LD: 2d ago  Reproductive/Obstetrics Mirena  POC preg test NEG                              Anesthesia Physical Anesthesia Plan  ASA: 2  Anesthesia Plan: General   Post-op Pain Management: Tylenol  PO (pre-op)*   Induction: Intravenous  PONV Risk Score and Plan: 3 and Ondansetron , Dexamethasone ,  Midazolam  and Treatment may vary due to age or medical condition  Airway Management Planned: Oral ETT  Additional Equipment: None  Intra-op Plan:   Post-operative Plan: Extubation in OR  Informed Consent: I have reviewed the patients History and Physical, chart, labs and discussed the procedure including the risks, benefits and alternatives for the proposed anesthesia with the patient or authorized representative who has indicated his/her understanding and acceptance.     Dental advisory given  Plan Discussed with: CRNA and Surgeon  Anesthesia Plan Comments:          Anesthesia Quick Evaluation

## 2024-02-20 NOTE — ED Provider Notes (Signed)
 [facility] Provider Note   CSN: [id number] Arrival date & time: 02/20/24  1043     Patient presents with: Flank Pain   [name] is a 53 y.o. female.    Flank Pain  Patient is a 53 year old female present emergency room today with severe left flank pain seems that her symptoms began over the weekend and progressively worsened she is a history of intrarenal kidney stones and states that she has already passed 1 in the past.  Has noticed darker urine has not had any fever or burning with urination.     Prior to Admission medications   Medication Sig Start Date End Date Taking? Authorizing Provider  aspirin EC 81 MG tablet Take 81 mg by mouth daily.   Yes [provider]  cetirizine (ZYRTEC) 10 MG tablet Take 10 mg by mouth daily as needed for allergies.   Yes [provider]  famotidine  (PEPCID ) 40 MG tablet TAKE 1 TABLET (40 MG TOTAL) BY MOUTH IN THE MORNING AND AT BEDTIME 02/09/24  Yes Lemmon, Delon Gibson, PA  fexofenadine (ALLEGRA) 180 MG tablet Take 180 mg by mouth daily as needed for allergies.   Yes [provider]  FLUoxetine (PROZAC) 10 MG capsule Take 20 mg by mouth daily. 12/31/20  Yes [provider]  omeprazole  (PRILOSEC) 40 MG capsule Take 1 capsule (40 mg total) by mouth daily. Office visit for further refills 02/12/24  Yes Lemmon, Delon Gibson, [place]  propranolol (INDERAL) 20 MG tablet Take 20 mg by mouth 2 (two) times daily. 10/27/21  Yes [provider]  Vitamin D , Ergocalciferol , (DRISDOL ) 1.25 MG (50000 UNIT) CAPS capsule Take 50,000 Units by mouth every 7 (seven) days. 10/28/21  Yes [provider]  ENBREL SURECLICK 50 MG/ML injection Inject 50 mg into the skin once a week. 12/02/20   [provider]  levonorgestrel  (MIRENA ) 20 MCG/24HR IUD 1 each by Intrauterine route once.    [provider]  ondansetron  (ZOFRAN -ODT) 4 MG disintegrating tablet Take 4 mg by mouth  every 6 (six) hours as needed for nausea or vomiting. 11/03/20   [provider]  primidone  (MYSOLINE ) 250 MG tablet Take 1 tablet (250 mg total) by mouth daily. 03/22/22   Tat, Asberry RAMAN, DO  rosuvastatin (CRESTOR) 5 MG tablet Take 5 mg by mouth daily. 07/19/23 10/17/23  [provider]  Specialty Vitamins Products (MENOPAUSE RELIEF PO) Take 2 capsules by mouth daily.    [provider]  sucralfate  (CARAFATE ) 1 g tablet Take 1 g by mouth daily as needed (stomach pain).    [provider]  ZEPBOUND 15 MG/0.5ML Pen Inject 2 mg into the skin once a week. 10/07/22   [provider]  zonisamide (ZONEGRAN) 25 MG capsule Take 100 mg by mouth at bedtime. For mirgraines 02/06/23   [provider]    Allergies: Penicillins, Azithromycin, Citalopram hydrobromide, Diclofenac sodium, Estrogens, Etodolac, Nsaids, Ciprofloxacin, Codeine, Diclofenac sodium, Doxycycline hyclate, Erythromycin, Levaquin [levofloxacin in d5w], and Nubain [nalbuphine hcl]    Review of Systems  Genitourinary:  Positive for flank pain.    Updated Vital Signs BP 107/78   Pulse 90   Temp 97.7 F (36.5 C) (Oral)   Resp 16   Ht 5' 3 (1.6 m)   Wt 50.8 kg   SpO2 99%   BMI 19.84 kg/m   Physical Exam Vitals and nursing note reviewed.  Constitutional:      General: She is not in acute distress. HENT:  Head: Normocephalic and atraumatic.     Nose: Nose normal.     Mouth/Throat:     Mouth: Mucous membranes are moist.  Eyes:     General: No scleral icterus. Cardiovascular:     Rate and Rhythm: Normal rate and regular rhythm.     Pulses: Normal pulses.     Heart sounds: Normal heart sounds.  Pulmonary:     Effort: Pulmonary effort is normal. No respiratory distress.     Breath sounds: No wheezing.  Abdominal:     Palpations: Abdomen is soft.     Tenderness: There is no abdominal tenderness. There is left CVA tenderness. There is no guarding or rebound.  Musculoskeletal:      Cervical back: Normal range of motion.     Right lower leg: No edema.     Left lower leg: No edema.  Skin:    General: Skin is warm and dry.     Capillary Refill: Capillary refill takes less than 2 seconds.  Neurological:     Mental Status: She is alert. Mental status is at baseline.  Psychiatric:        Mood and Affect: Mood normal.        Behavior: Behavior normal.     (all labs ordered are listed, but only abnormal results are displayed) Labs Reviewed  COMPREHENSIVE METABOLIC PANEL WITH GFR - Abnormal; Notable for the following components:      Result Value   CO2 17 (*)    All other components within normal limits  URINALYSIS, ROUTINE W REFLEX MICROSCOPIC - Abnormal; Notable for the following components:   APPearance HAZY (*)    Hgb urine dipstick LARGE (*)    Protein, ur 30 (*)    Bacteria, UA RARE (*)    All other components within normal limits  LIPASE, BLOOD  CBC WITH DIFFERENTIAL/PLATELET  CBC WITH DIFFERENTIAL/PLATELET  PREGNANCY, URINE    EKG: None  Radiology: CT Renal Stone Study Result Date: 02/20/2024 CLINICAL DATA:  Left flank pain and hematuria. EXAM: CT ABDOMEN AND PELVIS WITHOUT CONTRAST TECHNIQUE: Multidetector CT imaging of the abdomen and pelvis was performed following the standard protocol without IV contrast. RADIATION DOSE REDUCTION: This exam was performed according to the departmental dose-optimization program which includes automated exposure control, adjustment of the mA and/or kV according to patient size and/or use of iterative reconstruction technique. COMPARISON:  06/12/2019 FINDINGS: Lower chest: No acute abnormality. Hepatobiliary: Benign cyst in the anterior liver shows slight enlargement since 2021 now measuring up to 1.9 cm in maximum diameter compared to 1.3 cm. Internal density remains consistent with a simple cyst. Status post cholecystectomy. No biliary ductal dilatation. Pancreas: Unremarkable. No pancreatic ductal dilatation or  surrounding inflammatory changes. Spleen: Normal in size without focal abnormality. Adrenals/Urinary Tract: Multiple bilateral renal calculi identified with overall larger calculus burden compared to 2021. There is new mild-to-moderate left hydronephrosis and hydroureter with the ureter able to be followed into the pelvis to the level of an obstructing calculus measuring up to approximately 6 mm in maximum diameter and located at roughly the level of the acetabulum likely within a few cm of the UVJ. The bladder is decompressed. Stomach/Bowel: Bowel shows no evidence of obstruction, ileus, inflammation or lesion. The appendix is normal. No free intraperitoneal air. Vascular/Lymphatic: No significant vascular findings are present. No enlarged abdominal or pelvic lymph nodes. Reproductive: Uterus and bilateral adnexa are unremarkable. IUD device in the endometrial cavity shows appropriate positioning. Other: No abdominal wall hernia or abnormality.  No abdominopelvic ascites. Musculoskeletal: No acute or significant osseous findings. IMPRESSION: 1. Multiple bilateral renal calculi with overall larger calculus burden compared to 2021. 2. New mild-to-moderate left hydronephrosis and hydroureter with the ureter able to be followed into the pelvis to the level of an obstructing calculus measuring up to 6 mm in maximum diameter and located at roughly the level of the acetabulum likely within a few cm of the UVJ. 3. Benign cyst in the anterior liver shows slight enlargement since 2021 now measuring up to 1.9 cm in maximum diameter compared to 1.3 cm. Electronically Signed   By: Marcey Moan M.D.   On: 02/20/2024 12:00     .Critical Care  Performed by: Neldon Hamp RAMAN, PA Authorized by: Neldon Hamp RAMAN, PA   Critical care provider statement:    Critical care time (minutes):  35   Critical care time was exclusive of:  Separately billable procedures and treating other patients and teaching time   Critical care  was time spent personally by me on the following activities:  Development of treatment plan with patient or surrogate, review of old charts, re-evaluation of patient's condition, pulse oximetry, ordering and review of radiographic studies, ordering and review of laboratory studies, ordering and performing treatments and interventions, obtaining history from patient or surrogate, examination of patient and evaluation of patient's response to treatment   Care discussed with: admitting provider   Comments:     Patient here with severe pain that was difficult to control even with multiple doses of Dilaudid  as well as fentanyl  and morphine .  She required antiemetics and ultimately was taken to emergent surgery with urology for stent placement necessitating prophylactic antibiotics     Medications Ordered in the ED  acetaminophen  (TYLENOL ) tablet 1,000 mg (has no administration in time range)  0.9 %  sodium chloride  infusion ( Intravenous Restarted 02/20/24 1556)  scopolamine  (TRANSDERM-SCOP) 1 MG/3DAYS 1 mg (1 mg Transdermal Patch Applied 02/20/24 1535)  scopolamine  (TRANSDERM-SCOP) 1 MG/3DAYS (has no administration in time range)  sodium chloride  irrigation 0.9 % (3,000 mLs Bladder Irrigation Given 02/20/24 1619)  iohexol  (OMNIPAQUE ) 300 MG/ML solution (10 mLs Other Given 02/20/24 1619)  ondansetron  (ZOFRAN ) injection 4 mg (4 mg Intravenous Given 02/20/24 1126)  morphine  (PF) 4 MG/ML injection 4 mg (4 mg Intravenous Given 02/20/24 1125)  fentaNYL  (SUBLIMAZE ) injection 100 mcg (100 mcg Intravenous Given 02/20/24 1153)  ondansetron  (ZOFRAN ) injection 4 mg (4 mg Intravenous Given 02/20/24 1233)  HYDROmorphone  (DILAUDID ) injection 0.5 mg (0.5 mg Intravenous Given 02/20/24 1232)  ketorolac  (TORADOL ) 15 MG/ML injection 15 mg (15 mg Intravenous Given 02/20/24 1234)  HYDROmorphone  (DILAUDID ) injection 0.5 mg (0.5 mg Intravenous Given 02/20/24 1247)  acetaminophen  (OFIRMEV ) 10 MG/ML IV (  Override pull for Anesthesia  02/20/24 1615)  ceFAZolin  (ANCEF ) IVPB 2g/100 mL premix (2 g Intravenous Given 02/20/24 1604)    Clinical Course as of 02/20/24 1630  Tue Feb 20, 2024  1340 Urology: Delia of urology will touch base with other practitioners.  [WF]    Clinical Course User Index [WF] Neldon Hamp RAMAN, [place]                                 Medical Decision Making Amount and/or Complexity of Data Reviewed Labs: ordered. Radiology: ordered.  Risk Prescription drug management.   This patient presents to the ED for concern of left flank pain, this involves a number of treatment options, and is  a complaint that carries with it a high risk of complications and morbidity. A differential diagnosis was considered for the patient's symptoms which is discussed below:   The differential diagnosis of emergent flank pain includes, but is not limited to :Abdominal aortic aneurysm,, Renal artery embolism,Renal vein thrombosis, Aortic dissection, Mesenteric ischemia, Pyelonephritis, Renal infarction, Renal hemorrhage, Nephrolithiasis/ Renal Colic, Bladder tumor,Cystitis, Biliary colic, Pancreatitis Perforated peptic ulcer Appendicitis ,Inguinal Hernia, Diverticulitis, Bowel obstruction Ectopic Pregnancy,PID/TOA,Ovarian cyst, Ovarian torsion) Lower lobe pneumonia, Retroperitoneal hematoma/abscess/tumor, Epidural abscess, Epidural hematoma    Co morbidities: Discussed in HPI   Brief History:  Patient is a 53 year old female present emergency room today with severe left flank pain seems that her symptoms began over the weekend and progressively worsened she is a history of intrarenal kidney stones and states that she has already passed 1 in the past.  Has noticed darker urine has not had any fever or burning with urination.    EMR reviewed including pt PMHx, past surgical history and past visits to ER.   See HPI for more details   Lab Tests:   I personally reviewed all laboratory work and imaging.  Metabolic panel without any acute abnormality specifically kidney function within normal limits and no significant electrolyte abnormalities. CBC without leukocytosis or significant anemia.   Imaging Studies:  Abnormal findings. I personally reviewed all imaging studies. Imaging notable for Numerous intrarenal stones with left-sided ureterolithiasis   Cardiac Monitoring:  The patient was maintained on a cardiac monitor.  I personally viewed and interpreted the cardiac monitored which showed an underlying rhythm of: NSR NA   Medicines ordered:  I ordered medication including Dilaudid , Zofran , morphine , fentanyl  for pain and nausea Reevaluation of the patient after these medicines showed that the patient improved I have reviewed the patients home medicines and have made adjustments as needed   Critical Interventions:   urology consultation to facilitate surgery   Consults/Attending Physician   I requested consultation with urology,  and discussed lab and imaging findings as well as pertinent plan - they recommend: Surgery   Reevaluation:  After the interventions noted above I re-evaluated patient and found that they have :stayed the same   Social Determinants of Health:      Problem List / ED Course:  Patient with severe pain and difficult to control symptoms related to a large left-sided ureteral stone.  Ended up urgently going to surgery for stents.   Dispostion:  After consideration of the diagnostic results and the patients response to treatment, I feel that the patent would benefit from admission   Final diagnoses:  Ureterolithiasis    ED Discharge Orders     None          Neldon Hamp RAMAN, [place] 02/20/24 [id number]    Geraldene Hamilton, MD 02/23/24 1714

## 2024-02-20 NOTE — ED Notes (Signed)
 Patient has not had food or drink since 6pm yesterday.

## 2024-02-20 NOTE — Consult Note (Signed)
 Urology Consult Note   Requesting Attending Physician:  No att. providers found Service Providing Consult: Urology  Consulting Attending: Dr. Carolee   Reason for Consult:  ureteral stone  HPI: [name] is seen in consultation for reasons noted above at the request of No att. providers found. Patient is a 53 y.o. female presenting to [facility] emergency department.  She reports initially having hypogastric pain on Saturday but no pain with urination.  She did experience significant urgency and bladder spasms.  This morning left-sided flank pain worsened significantly prompting her to present to urgent care and then went to the emergency department.  CT A/P noted left side hydronephrosis and 6 mm obstructing ureteral stone.  Urinalysis is slightly contaminated and overwhelmingly unremarkable.  She denies fever, chills, night sweats, or hematuria.  On my arrival patient was quite sedate but resting comfortably after a large amount of various pain medications.  I reviewed the case and plan including CT imaging and infographic's with her and her husband.  They are amenable to proceeding to the OR with definitive stone management on an outpatient basis.  ------------------  Assessment:   53 y.o. female with 6 mm left UVJ stone.   Recommendations: # Ureteral stone To the OR for left ureteral stent placement with Dr. Carolee.  Definitive stone management on outpatient basis.  She has had many intrarenal stones for years.  Can evaluate cleaning some of these out if they are not impacted on a later date. Urinalysis unremarkable Labs and VSS  Case and plan discussed with Dr. 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    Past Surgical History:  Past Surgical History:  Procedure Laterality Date   BRAVO PH STUDY N/A 08/15/2019   Procedure: BRAVO PH STUDY;  Surgeon: Teressa Toribio SQUIBB, MD;  Location: [facility];  Service: Endoscopy;  Laterality: N/A;   CHOLECYSTECTOMY     ESOPHAGOGASTRODUODENOSCOPY (EGD) WITH PROPOFOL  N/A 08/15/2019   Procedure: ESOPHAGOGASTRODUODENOSCOPY (EGD) WITH PROPOFOL ;  Surgeon: Teressa Toribio SQUIBB, MD;  Location: [facility] END[facility];  Service: Endoscopy;  Laterality: N/A;   INTRAUTERINE DEVICE (IUD) INSERTION     mirena  insertion 07-05-18   ovarian tumor     benign   TONSILLECTOMY     TRANSESOPHAGEAL ECHOCARDIOGRAM (CATH LAB) N/A 09/01/2023   Procedure: TRANSESOPHAGEAL ECHOCARDIOGRAM;  Surgeon: Pietro Redell RAMAN, MD;  Location: [facility] INVASIVE CV LAB;  Service: Cardiovascular;  Laterality: N/A;    Medication: No current facility-administered medications for this encounter.    Allergies: Allergies  Allergen Reactions   Penicillins Rash    Did it involve swelling of the face/tongue/throat, SOB, or low BP? No Did it involve sudden or severe rash/hives, skin peeling, or any reaction on the inside of your mouth or nose? Yes Did you need to seek medical attention at a hospital or doctor's office? Yes When did it last happen?      20 Years If all above answers are NO, may proceed with cephalosporin use.    Azithromycin Other (See Comments)   Citalopram Hydrobromide Other (See Comments)   Diclofenac Sodium Other (See Comments)    Stomach pains  Estrogens Other (See Comments)    Blood clot to right eye   Etodolac Other (See Comments)    Stomach pains   Nsaids Other (See Comments)    Burns stomach    Ciprofloxacin Rash   Codeine Rash   Diclofenac Sodium Rash   Doxycycline Hyclate     Upset stomach   Erythromycin Other (See Comments)    Upset stomach   Levaquin [Levofloxacin In D5w] Rash   Nubain [Nalbuphine Hcl] Rash    Social History: Social History   Tobacco  Use   Smoking status: Never   Smokeless tobacco: Never  Vaping Use   Vaping status: Never Used  Substance Use Topics   Alcohol use: Yes    Alcohol/week: 3.0 standard drinks of alcohol    Types: 3 Glasses of wine per week    Comment: 3 GLASSES A WEEK    Drug use: No    Family History Family History  Problem Relation Age of Onset   Rheumatologic disease Father    Tremor Father    Colon polyps Father    Stroke Father    Atrial fibrillation Father    Other Sister        covid pneumonia- died from this   Heart attack Sister    Liver disease Sister        NASH   Colon cancer Paternal Grandmother        died 3   Heart attack Paternal Grandfather    Depression Daughter    ADD / ADHD Son    Other Son        dyslexia   Colon cancer Maternal Uncle        died 23   Colon polyps Paternal Aunt     Review of Systems  Genitourinary:  Positive for flank pain and urgency. Negative for dysuria, frequency and hematuria.     Objective   Vital signs in last 24 hours: BP 112/78 (BP Location: Left Arm)   Pulse 82   Temp (!) 97.5 F (36.4 C) (Oral)   Resp 16   SpO2 100%   Physical Exam General: A&O, resting, appropriate HEENT: [place]/AT Pulmonary: Normal work of breathing Cardiovascular: no cyanosis Abdomen: Soft, NTTP, nondistended   Most Recent Labs: Lab Results  Component Value Date   WBC 6.1 02/20/2024   HGB 12.8 02/20/2024   HCT 41.1 02/20/2024   PLT 300 02/20/2024    Lab Results  Component Value Date   NA 139 02/20/2024   K 4.3 02/20/2024   CL 108 02/20/2024   CO2 17 (L) 02/20/2024   BUN 15 02/20/2024   CREATININE 0.90 02/20/2024   CALCIUM 9.5 02/20/2024    Lab Results  Component Value Date   INR 1.0 06/09/2022   APTT 27 06/09/2022     Urine Culture: @[id number] (laburin,org,[id number])@   IMAGING: CT Renal Stone Study Result Date: 02/20/2024 CLINICAL DATA:  Left flank pain and hematuria. EXAM: CT ABDOMEN AND PELVIS WITHOUT CONTRAST TECHNIQUE:  Multidetector CT imaging of the abdomen and pelvis was performed following the standard protocol without IV contrast. RADIATION DOSE REDUCTION: This exam was performed according to the departmental dose-optimization program which includes automated exposure control, adjustment of the mA and/or kV according to patient size and/or use of iterative reconstruction technique. COMPARISON:  06/12/2019 FINDINGS: Lower chest: No acute abnormality. Hepatobiliary: Benign cyst in the anterior liver shows slight enlargement since 2021 now measuring up to 1.9 cm in maximum diameter compared to 1.3 cm. Internal density remains consistent  with a simple cyst. Status post cholecystectomy. No biliary ductal dilatation. Pancreas: Unremarkable. No pancreatic ductal dilatation or surrounding inflammatory changes. Spleen: Normal in size without focal abnormality. Adrenals/Urinary Tract: Multiple bilateral renal calculi identified with overall larger calculus burden compared to 2021. There is new mild-to-moderate left hydronephrosis and hydroureter with the ureter able to be followed into the pelvis to the level of an obstructing calculus measuring up to approximately 6 mm in maximum diameter and located at roughly the level of the acetabulum likely within a few cm of the UVJ. The bladder is decompressed. Stomach/Bowel: Bowel shows no evidence of obstruction, ileus, inflammation or lesion. The appendix is normal. No free intraperitoneal air. Vascular/Lymphatic: No significant vascular findings are present. No enlarged abdominal or pelvic lymph nodes. Reproductive: Uterus and bilateral adnexa are unremarkable. IUD device in the endometrial cavity shows appropriate positioning. Other: No abdominal wall hernia or abnormality. No abdominopelvic ascites. Musculoskeletal: No acute or significant osseous findings. IMPRESSION: 1. Multiple bilateral renal calculi with overall larger calculus burden compared to 2021. 2. New mild-to-moderate left  hydronephrosis and hydroureter with the ureter able to be followed into the pelvis to the level of an obstructing calculus measuring up to 6 mm in maximum diameter and located at roughly the level of the acetabulum likely within a few cm of the UVJ. 3. Benign cyst in the anterior liver shows slight enlargement since 2021 now measuring up to 1.9 cm in maximum diameter compared to 1.3 cm. Electronically Signed   By: Marcey Moan M.D.   On: 02/20/2024 12:00    ------  Ole Bourdon, NP Pager: [phone]   Please contact the urology consult pager with any further questions/concerns.

## 2024-02-20 NOTE — Discharge Instructions (Signed)

## 2024-02-21 ENCOUNTER — Encounter (HOSPITAL_COMMUNITY): Payer: Self-pay | Admitting: Urology

## 2024-02-23 DIAGNOSIS — N202 Calculus of kidney with calculus of ureter: Secondary | ICD-10-CM | POA: Diagnosis not present

## 2024-02-23 DIAGNOSIS — R8271 Bacteriuria: Secondary | ICD-10-CM | POA: Diagnosis not present

## 2024-02-26 DIAGNOSIS — N23 Unspecified renal colic: Secondary | ICD-10-CM | POA: Diagnosis not present

## 2024-03-05 DIAGNOSIS — Z8673 Personal history of transient ischemic attack (TIA), and cerebral infarction without residual deficits: Secondary | ICD-10-CM | POA: Diagnosis not present

## 2024-03-05 DIAGNOSIS — R636 Underweight: Secondary | ICD-10-CM | POA: Diagnosis not present

## 2024-03-05 DIAGNOSIS — M8580 Other specified disorders of bone density and structure, unspecified site: Secondary | ICD-10-CM | POA: Diagnosis not present

## 2024-03-05 DIAGNOSIS — R634 Abnormal weight loss: Secondary | ICD-10-CM | POA: Diagnosis not present

## 2024-03-26 ENCOUNTER — Ambulatory Visit: Payer: No Typology Code available for payment source | Admitting: Neurology

## 2024-03-27 DIAGNOSIS — M0579 Rheumatoid arthritis with rheumatoid factor of multiple sites without organ or systems involvement: Secondary | ICD-10-CM | POA: Diagnosis not present

## 2024-03-28 DIAGNOSIS — M7061 Trochanteric bursitis, right hip: Secondary | ICD-10-CM | POA: Diagnosis not present

## 2024-04-04 DIAGNOSIS — Z87898 Personal history of other specified conditions: Secondary | ICD-10-CM | POA: Diagnosis not present

## 2024-04-04 DIAGNOSIS — I7300 Raynaud's syndrome without gangrene: Secondary | ICD-10-CM | POA: Diagnosis not present

## 2024-04-04 DIAGNOSIS — G250 Essential tremor: Secondary | ICD-10-CM | POA: Diagnosis not present

## 2024-04-04 DIAGNOSIS — Z87440 Personal history of urinary (tract) infections: Secondary | ICD-10-CM | POA: Diagnosis not present

## 2024-04-08 DIAGNOSIS — M25551 Pain in right hip: Secondary | ICD-10-CM | POA: Diagnosis not present

## 2024-04-09 DIAGNOSIS — N200 Calculus of kidney: Secondary | ICD-10-CM | POA: Diagnosis not present

## 2024-04-10 DIAGNOSIS — M25551 Pain in right hip: Secondary | ICD-10-CM | POA: Diagnosis not present

## 2024-04-16 DIAGNOSIS — M25551 Pain in right hip: Secondary | ICD-10-CM | POA: Diagnosis not present

## 2024-04-18 DIAGNOSIS — M25551 Pain in right hip: Secondary | ICD-10-CM | POA: Diagnosis not present

## 2024-05-06 DIAGNOSIS — L811 Chloasma: Secondary | ICD-10-CM | POA: Diagnosis not present

## 2024-05-06 DIAGNOSIS — M7130 Other bursal cyst, unspecified site: Secondary | ICD-10-CM | POA: Diagnosis not present

## 2024-05-06 DIAGNOSIS — R202 Paresthesia of skin: Secondary | ICD-10-CM | POA: Diagnosis not present

## 2024-05-07 DIAGNOSIS — M25551 Pain in right hip: Secondary | ICD-10-CM | POA: Diagnosis not present

## 2024-05-08 ENCOUNTER — Ambulatory Visit: Admitting: Radiology

## 2024-05-09 DIAGNOSIS — M25559 Pain in unspecified hip: Secondary | ICD-10-CM | POA: Insufficient documentation

## 2024-05-12 ENCOUNTER — Other Ambulatory Visit: Payer: Self-pay | Admitting: Physician Assistant

## 2024-05-13 DIAGNOSIS — G43719 Chronic migraine without aura, intractable, without status migrainosus: Secondary | ICD-10-CM | POA: Diagnosis not present

## 2024-05-17 ENCOUNTER — Other Ambulatory Visit: Payer: Self-pay | Admitting: Neurology

## 2024-05-17 DIAGNOSIS — R9082 White matter disease, unspecified: Secondary | ICD-10-CM

## 2024-05-21 DIAGNOSIS — K1379 Other lesions of oral mucosa: Secondary | ICD-10-CM | POA: Diagnosis not present

## 2024-05-24 DIAGNOSIS — K116 Mucocele of salivary gland: Secondary | ICD-10-CM | POA: Diagnosis not present

## 2024-06-03 ENCOUNTER — Ambulatory Visit: Admitting: Family Medicine

## 2024-06-03 ENCOUNTER — Encounter: Payer: Self-pay | Admitting: Family Medicine

## 2024-06-03 VITALS — BP 116/74 | HR 73 | Temp 98.0°F | Ht 63.0 in | Wt 105.0 lb

## 2024-06-03 DIAGNOSIS — Z681 Body mass index (BMI) 19 or less, adult: Secondary | ICD-10-CM | POA: Diagnosis not present

## 2024-06-03 DIAGNOSIS — F4323 Adjustment disorder with mixed anxiety and depressed mood: Secondary | ICD-10-CM

## 2024-06-03 DIAGNOSIS — I7300 Raynaud's syndrome without gangrene: Secondary | ICD-10-CM | POA: Diagnosis not present

## 2024-06-03 DIAGNOSIS — N951 Menopausal and female climacteric states: Secondary | ICD-10-CM

## 2024-06-03 DIAGNOSIS — Z87442 Personal history of urinary calculi: Secondary | ICD-10-CM | POA: Diagnosis not present

## 2024-06-03 DIAGNOSIS — M25551 Pain in right hip: Secondary | ICD-10-CM | POA: Diagnosis not present

## 2024-06-03 DIAGNOSIS — G250 Essential tremor: Secondary | ICD-10-CM

## 2024-06-03 DIAGNOSIS — E559 Vitamin D deficiency, unspecified: Secondary | ICD-10-CM

## 2024-06-03 DIAGNOSIS — M368 Systemic disorders of connective tissue in other diseases classified elsewhere: Secondary | ICD-10-CM

## 2024-06-03 DIAGNOSIS — M7061 Trochanteric bursitis, right hip: Secondary | ICD-10-CM | POA: Insufficient documentation

## 2024-06-03 MED ORDER — VITAMIN D (ERGOCALCIFEROL) 1.25 MG (50000 UNIT) PO CAPS: 50000.0000 [IU] | ORAL_CAPSULE | ORAL | 0 refills | Status: DC

## 2024-06-03 NOTE — Progress Notes (Unsigned)
 RAYNAUDS KIDNEY STONE DROPED PROPANOLOL 20 TO 10 ONSERA HEALTH VIT D  TORE LIGAMENT IN HIP - ? HIP REPLACEMENT 60  HOT FLASHES - VEOZAH

## 2024-06-04 ENCOUNTER — Inpatient Hospital Stay
Admission: RE | Admit: 2024-06-04 | Discharge: 2024-06-04 | Disposition: A | Source: Ambulatory Visit | Attending: Neurology | Admitting: Neurology

## 2024-06-04 DIAGNOSIS — R9082 White matter disease, unspecified: Secondary | ICD-10-CM

## 2024-06-04 MED ORDER — GADOPICLENOL 0.5 MMOL/ML IV SOLN
7.5000 mL | Freq: Once | INTRAVENOUS | Status: AC | PRN
  Administered 2024-06-04: 5 mL via INTRAVENOUS

## 2024-06-05 DIAGNOSIS — Z87442 Personal history of urinary calculi: Secondary | ICD-10-CM | POA: Insufficient documentation

## 2024-06-05 DIAGNOSIS — M368 Systemic disorders of connective tissue in other diseases classified elsewhere: Secondary | ICD-10-CM | POA: Insufficient documentation

## 2024-06-05 DIAGNOSIS — E559 Vitamin D deficiency, unspecified: Secondary | ICD-10-CM | POA: Insufficient documentation

## 2024-06-05 DIAGNOSIS — G250 Essential tremor: Secondary | ICD-10-CM | POA: Insufficient documentation

## 2024-06-05 DIAGNOSIS — N951 Menopausal and female climacteric states: Secondary | ICD-10-CM | POA: Insufficient documentation

## 2024-06-05 DIAGNOSIS — Z681 Body mass index (BMI) 19 or less, adult: Secondary | ICD-10-CM | POA: Insufficient documentation

## 2024-06-05 MED ORDER — VITAMIN D (ERGOCALCIFEROL) 1.25 MG (50000 UNIT) PO CAPS: 50000.0000 [IU] | ORAL_CAPSULE | ORAL | 0 refills | Status: AC

## 2024-07-09 NOTE — Addendum Note (Signed)
 Encounter addended by: Mariea Mcmartin, RT on: 07/09/2024 6:39 AM  Actions taken: Imaging Exam ended

## 2024-07-11 ENCOUNTER — Ambulatory Visit: Admitting: Radiology

## 2024-07-15 ENCOUNTER — Ambulatory Visit: Admitting: Family Medicine

## 2024-08-19 ENCOUNTER — Ambulatory Visit: Admitting: Radiology
